# Patient Record
Sex: Male | Born: 1970 | Race: Black or African American | Hispanic: No | Marital: Married | Smoking: Never smoker
Health system: Southern US, Community
[De-identification: ages and names within clinical notes are randomized; demographics above are authoritative.]

## PROBLEM LIST (undated history)

## (undated) HISTORY — PX: COLONOSCOPY: SHX174

## (undated) HISTORY — PX: UPPER GASTROINTESTINAL ENDOSCOPY: SHX188

---

## 2010-07-15 ENCOUNTER — Ambulatory Visit: Payer: Self-pay | Admitting: Oncology

## 2010-07-18 LAB — CBC WITH DIFFERENTIAL/PLATELET
Basophils Absolute: 0 10*3/uL (ref 0.0–0.1)
Eosinophils Absolute: 0 10*3/uL (ref 0.0–0.5)
HGB: 14.4 g/dL (ref 13.0–17.1)
LYMPH%: 29.4 % (ref 14.0–49.0)
MCV: 87.8 fL (ref 79.3–98.0)
MONO%: 11.9 % (ref 0.0–14.0)
NEUT#: 2.1 10*3/uL (ref 1.5–6.5)
Platelets: 258 10*3/uL (ref 140–400)

## 2010-07-18 LAB — COMPREHENSIVE METABOLIC PANEL
CO2: 27 mEq/L (ref 19–32)
Creatinine, Ser: 0.99 mg/dL (ref 0.40–1.50)
Glucose, Bld: 111 mg/dL — ABNORMAL HIGH (ref 70–99)
Sodium: 136 mEq/L (ref 135–145)
Total Bilirubin: 0.5 mg/dL (ref 0.3–1.2)
Total Protein: 7.4 g/dL (ref 6.0–8.3)

## 2010-08-20 ENCOUNTER — Ambulatory Visit: Payer: Self-pay | Admitting: Oncology

## 2010-08-22 LAB — CBC WITH DIFFERENTIAL/PLATELET
Basophils Absolute: 0 10*3/uL (ref 0.0–0.1)
Eosinophils Absolute: 0.1 10*3/uL (ref 0.0–0.5)
LYMPH%: 33.9 % (ref 14.0–49.0)
MCV: 85.9 fL (ref 79.3–98.0)
MONO%: 8.6 % (ref 0.0–14.0)
NEUT#: 3.2 10*3/uL (ref 1.5–6.5)
Platelets: 239 10*3/uL (ref 140–400)
RBC: 5.1 10*6/uL (ref 4.20–5.82)

## 2010-08-22 LAB — COMPREHENSIVE METABOLIC PANEL
Alkaline Phosphatase: 53 U/L (ref 39–117)
BUN: 15 mg/dL (ref 6–23)
Glucose, Bld: 108 mg/dL — ABNORMAL HIGH (ref 70–99)
Total Bilirubin: 0.6 mg/dL (ref 0.3–1.2)

## 2011-07-14 ENCOUNTER — Encounter (INDEPENDENT_AMBULATORY_CARE_PROVIDER_SITE_OTHER): Payer: Self-pay | Admitting: General Surgery

## 2011-07-27 ENCOUNTER — Ambulatory Visit (INDEPENDENT_AMBULATORY_CARE_PROVIDER_SITE_OTHER): Payer: Self-pay | Admitting: General Surgery

## 2012-11-03 DIAGNOSIS — B0089 Other herpesviral infection: Secondary | ICD-10-CM | POA: Insufficient documentation

## 2012-11-03 DIAGNOSIS — B009 Herpesviral infection, unspecified: Secondary | ICD-10-CM | POA: Insufficient documentation

## 2013-02-01 DIAGNOSIS — B181 Chronic viral hepatitis B without delta-agent: Secondary | ICD-10-CM | POA: Insufficient documentation

## 2013-02-01 DIAGNOSIS — B1910 Unspecified viral hepatitis B without hepatic coma: Secondary | ICD-10-CM | POA: Insufficient documentation

## 2013-06-02 ENCOUNTER — Ambulatory Visit
Admission: RE | Admit: 2013-06-02 | Discharge: 2013-06-02 | Disposition: A | Discharge status: Home / Self Care | Payer: Worker's Compensation | Source: Ambulatory Visit | Attending: Internal Medicine | Admitting: Internal Medicine

## 2013-06-02 ENCOUNTER — Other Ambulatory Visit: Payer: Self-pay | Admitting: Internal Medicine

## 2013-06-02 DIAGNOSIS — S20211A Contusion of right front wall of thorax, initial encounter: Secondary | ICD-10-CM

## 2014-10-30 ENCOUNTER — Emergency Department (HOSPITAL_COMMUNITY)
Admission: EM | Admit: 2014-10-30 | Discharge: 2014-10-30 | Disposition: A | Discharge status: Home / Self Care | Payer: Self-pay | Attending: Emergency Medicine | Admitting: Emergency Medicine

## 2014-10-30 ENCOUNTER — Encounter (HOSPITAL_COMMUNITY): Payer: Self-pay | Admitting: *Deleted

## 2014-10-30 DIAGNOSIS — S29002A Unspecified injury of muscle and tendon of back wall of thorax, initial encounter: Secondary | ICD-10-CM | POA: Insufficient documentation

## 2014-10-30 DIAGNOSIS — S161XXA Strain of muscle, fascia and tendon at neck level, initial encounter: Secondary | ICD-10-CM | POA: Insufficient documentation

## 2014-10-30 DIAGNOSIS — Y998 Other external cause status: Secondary | ICD-10-CM | POA: Insufficient documentation

## 2014-10-30 DIAGNOSIS — Y9389 Activity, other specified: Secondary | ICD-10-CM | POA: Insufficient documentation

## 2014-10-30 DIAGNOSIS — Z7952 Long term (current) use of systemic steroids: Secondary | ICD-10-CM | POA: Insufficient documentation

## 2014-10-30 DIAGNOSIS — Y92410 Unspecified street and highway as the place of occurrence of the external cause: Secondary | ICD-10-CM | POA: Insufficient documentation

## 2014-10-30 DIAGNOSIS — Z79899 Other long term (current) drug therapy: Secondary | ICD-10-CM | POA: Insufficient documentation

## 2014-10-30 MED ORDER — IBUPROFEN 600 MG PO TABS: 600.0000 mg | ORAL_TABLET | Freq: Four times a day (QID) | ORAL | Status: DC | PRN

## 2014-10-30 MED ORDER — TRAMADOL HCL 50 MG PO TABS: 50.0000 mg | ORAL_TABLET | Freq: Four times a day (QID) | ORAL | Status: DC | PRN

## 2014-10-30 MED ORDER — DIAZEPAM 5 MG PO TABS: 5.0000 mg | ORAL_TABLET | Freq: Two times a day (BID) | ORAL | Status: DC | PRN
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
## 2021-03-06 ENCOUNTER — Ambulatory Visit (HOSPITAL_COMMUNITY)
Admission: EM | Admit: 2021-03-06 | Discharge: 2021-03-06 | Disposition: A | Discharge status: Home / Self Care | Payer: Self-pay | Attending: Physician Assistant | Admitting: Physician Assistant

## 2021-03-06 ENCOUNTER — Encounter (HOSPITAL_COMMUNITY): Payer: Self-pay

## 2021-03-06 ENCOUNTER — Other Ambulatory Visit: Payer: Self-pay

## 2021-03-06 DIAGNOSIS — R1310 Dysphagia, unspecified: Secondary | ICD-10-CM

## 2021-03-06 DIAGNOSIS — R001 Bradycardia, unspecified: Secondary | ICD-10-CM

## 2021-03-06 DIAGNOSIS — R142 Eructation: Secondary | ICD-10-CM

## 2021-03-06 DIAGNOSIS — R21 Rash and other nonspecific skin eruption: Secondary | ICD-10-CM

## 2021-03-06 MED ORDER — LIDOCAINE VISCOUS HCL 2 % MT SOLN
OROMUCOSAL | Status: AC
  Filled 2021-03-06: qty 15

## 2021-03-06 MED ORDER — ALUM & MAG HYDROXIDE-SIMETH 200-200-20 MG/5ML PO SUSP
ORAL | Status: AC
  Filled 2021-03-06: qty 30

## 2021-03-06 MED ORDER — ALUM & MAG HYDROXIDE-SIMETH 200-200-20 MG/5ML PO SUSP
30.0000 mL | Freq: Once | ORAL | Status: AC
  Administered 2021-03-06: 30 mL via ORAL

## 2021-03-06 MED ORDER — PANTOPRAZOLE SODIUM 40 MG PO TBEC: 40.0000 mg | DELAYED_RELEASE_TABLET | Freq: Every day | ORAL | 0 refills | Status: DC

## 2021-03-06 MED ORDER — SUCRALFATE 1 G PO TABS: 1.0000 g | ORAL_TABLET | Freq: Three times a day (TID) | ORAL | 0 refills | Status: DC | PRN

## 2021-03-06 MED ORDER — LIDOCAINE VISCOUS HCL 2 % MT SOLN
15.0000 mL | Freq: Once | OROMUCOSAL | Status: AC
  Administered 2021-03-06: 15 mL via ORAL

## 2021-03-06 MED ORDER — TRIAMCINOLONE ACETONIDE 0.1 % EX CREA: 1.0000 "application " | TOPICAL_CREAM | Freq: Two times a day (BID) | CUTANEOUS | 0 refills | Status: DC

## 2021-03-06 NOTE — ED Provider Notes (Signed)
[facility]    CSN: [id number] Arrival date & time: 03/06/21  7829      History   Chief Complaint Chief Complaint  Patient presents with   Chest Pain   Fatigue    HPI Juan Palmer is a 50 y.o. male.   Patient presenting today with about a week of burning pain and pressure that extends from the lower neck down toward central chest and only occurs with swallowing or eating.  Pain has been worse the last 24 hours.  Some fatigue but otherwise no other associated new symptoms and feeling in usual state of health otherwise.  Denies shortness of breath, headache, dizziness, lightheadedness, syncope, abdominal pain, nausea vomiting or diarrhea.  No known past medical problems.  Has not tried anything over-the-counter for symptoms.   History reviewed. No pertinent past medical history.  There are no problems to display for this patient.   History reviewed. No pertinent surgical history.   Home Medications    Prior to Admission medications   Medication Sig Start Date End Date Taking? Authorizing Provider  pantoprazole (PROTONIX) 40 MG tablet Take 1 tablet (40 mg total) by mouth daily. 03/06/21  Yes Particia Nearing, PA-C  sucralfate (CARAFATE) 1 g tablet Take 1 tablet (1 g total) by mouth 3 (three) times daily as needed. Dissolve 1 tablet in a glass of water and drink 03/06/21  Yes Particia Nearing, PA-C  triamcinolone cream (KENALOG) 0.1 % Apply 1 application topically 2 (two) times daily. 03/06/21  Yes Particia Nearing, PA-C  calcium-vitamin D (OSCAL WITH D) 500-200 MG-UNIT per tablet Take 1 tablet by mouth daily.      [provider]  diazepam (VALIUM) 5 MG tablet Take 1 tablet (5 mg total) by mouth every 12 (twelve) hours as needed for anxiety or muscle spasms. 10/30/14   Lurene Shadow, PA-C  hydrocortisone (ANUSOL-HC) 2.5 % rectal cream Place rectally 2 (two) times daily.      [provider]  ibuprofen (ADVIL,MOTRIN) 600 MG tablet Take  1 tablet (600 mg total) by mouth every 6 (six) hours as needed. 10/30/14   Lurene Shadow, PA-C  MULTIPLE VITAMIN PO Take by mouth.      [provider]  traMADol (ULTRAM) 50 MG tablet Take 1 tablet (50 mg total) by mouth every 6 (six) hours as needed. 10/30/14   Lurene Shadow, PA-C    Family History Family History  Problem Relation Age of Onset   Healthy Mother    Healthy Father     Social History Social History   Tobacco Use   Smoking status: Never   Smokeless tobacco: Never  Substance Use Topics   Alcohol use: No   Drug use: No   Allergies   Patient has no known allergies.   Review of Systems Review of Systems Per HPI  Physical Exam Triage Vital Signs ED Triage Vitals  Enc Vitals Group     BP 03/06/21 1001 115/71     Pulse Rate 03/06/21 1001 (!) 55     Resp 03/06/21 1001 19     Temp 03/06/21 1001 97.9 F (36.6 C)     Temp src --      SpO2 03/06/21 1001 100 %     Weight --      Height --      Head Circumference --      Peak Flow --      Pain Score 03/06/21 0959 4  Pain Loc --      Pain Edu? --      Excl. in GC? --    No data found.  Updated Vital Signs BP 115/71   Pulse (!) 55   Temp 97.9 F (36.6 C)   Resp 19   SpO2 100%   Visual Acuity Right Eye Distance:   Left Eye Distance:   Bilateral Distance:    Right Eye Near:   Left Eye Near:    Bilateral Near:     Physical Exam Vitals and nursing note reviewed.  Constitutional:      Appearance: Normal appearance.  HENT:     Head: Atraumatic.     Mouth/Throat:     Mouth: Mucous membranes are moist.     Pharynx: Oropharynx is clear.  Eyes:     Extraocular Movements: Extraocular movements intact.     Conjunctiva/sclera: Conjunctivae normal.  Cardiovascular:     Rate and Rhythm: Regular rhythm. Bradycardia present.     Heart sounds: Normal heart sounds.  Pulmonary:     Effort: Pulmonary effort is normal. No respiratory distress.     Breath sounds: Normal breath sounds. No  wheezing or rales.  Abdominal:     General: Bowel sounds are normal. There is no distension.     Palpations: Abdomen is soft.     Tenderness: There is no abdominal tenderness. There is no guarding.  Musculoskeletal:        General: Normal range of motion.     Cervical back: Normal range of motion and neck supple.  Skin:    General: Skin is warm and dry.  Neurological:     General: No focal deficit present.     Mental Status: He is oriented to person, place, and time.  Psychiatric:        Mood and Affect: Mood normal.        Thought Content: Thought content normal.        Judgment: Judgment normal.   UC Treatments / Results  Labs (all labs ordered are listed, but only abnormal results are displayed) Labs Reviewed - No data to display  EKG  Radiology No results found.  Procedures Procedures (including critical care time)  Medications Ordered in UC Medications  alum & mag hydroxide-simeth (MAALOX/MYLANTA) 200-200-20 MG/5ML suspension 30 mL (30 mLs Oral Given 03/06/21 1035)    And  lidocaine (XYLOCAINE) 2 % viscous mouth solution 15 mL (15 mLs Oral Given 03/06/21 1035)    Initial Impression / Assessment and Plan / UC Course  I have reviewed the triage vital signs and the nursing notes.  Pertinent labs & imaging results that were available during my care of the patient were reviewed by me and considered in my medical decision making (see chart for details).     Vital signs reassuring other than bradycardia which per chart review and patient report is fairly normal for him.  EKG showing sinus bradycardia but otherwise benign appearing.  GI cocktail administered to attempt to determine if indigestion related, which symptoms are consistent with.  Complete resolution of his pain with swallowing after GI cocktail so suspect some reflux esophagitis causing his pain at this point.  Treat with Carafate, Protonix, bland foods.  Has a new patient appointment with primary care in 2 weeks  and can use this is a follow-up for his current symptoms.  He is aware to go to the emergency room if symptoms worsening at any point.  He is also requesting a refill on  triamcinolone cream for some itchy and dry patches that he is had for years.  He states he ran out since moving from [place] and is waiting to establish care next month to get refills.  Refill sent.  Final Clinical Impressions(s) / UC Diagnoses   Final diagnoses:  Pain with swallowing  Belching  Rash and nonspecific skin eruption   Discharge Instructions   None    ED Prescriptions     Medication Sig Dispense Auth. Provider   triamcinolone cream (KENALOG) 0.1 % Apply 1 application topically 2 (two) times daily. 90 g Particia Nearing, PA-C   sucralfate (CARAFATE) 1 g tablet Take 1 tablet (1 g total) by mouth 3 (three) times daily as needed. Dissolve 1 tablet in a glass of water and drink 60 tablet Particia Nearing, PA-C   pantoprazole (PROTONIX) 40 MG tablet Take 1 tablet (40 mg total) by mouth daily. 30 tablet Particia Nearing, [place]      PDMP not reviewed this encounter.   Particia Nearing, [place] 03/06/21 1116

## 2021-03-06 NOTE — ED Triage Notes (Signed)
Pt presents with complaints of pain and heaviness in his chest only when he swallows. Denies pain any other time. Pt also endorses fatigue. States symptoms started x 1 week ago. Denies any other symptoms.

## 2021-03-26 ENCOUNTER — Ambulatory Visit: Payer: Self-pay | Admitting: Family Medicine

## 2021-03-31 ENCOUNTER — Other Ambulatory Visit: Payer: Self-pay | Admitting: Family Medicine

## 2021-04-02 NOTE — Progress Notes (Deleted)
    SUBJECTIVE:   CHIEF COMPLAINT / HPI: establish care  50 yo male presents today to establish care  PMH: PSH: Meds: Allergies: Fam hx: Social Hx: Patient is originally from ***. He moved to the [place] in ***.   PERTINENT  PMH / PSH: ***  OBJECTIVE:   There were no vitals taken for this visit.  ***  ASSESSMENT/PLAN:   No problem-specific Assessment & Plan notes found for this encounter.     Shirlean Mylar, MD [facility]

## 2021-04-03 ENCOUNTER — Ambulatory Visit: Payer: Self-pay | Admitting: Family Medicine

## 2021-04-10 ENCOUNTER — Ambulatory Visit: Payer: Self-pay | Admitting: Student

## 2021-04-18 NOTE — Patient Instructions (Addendum)
It was nice seeing you today!  I am concerned about your weight.  Follow-up in 2 weeks. We will go over lab results then.  Please arrive at least 15 minutes prior to your scheduled appointments.  Stay well, Juan Deeds, MD [facility] [phone]

## 2021-04-18 NOTE — Progress Notes (Signed)
    SUBJECTIVE:   CHIEF COMPLAINT / HPI: establish care  Recently moved from [place] this past January. Lives with wife and 3 kids. Previously lived in [place].  Recently seen in the ED on 6/23 for pain with swallowing, improved with GI cocktail so was thought to be some component of reflux esophagitis so was prescribed pantoprazole and sucralfate. Today, patient states he never had any pain with swallowing and that the medications did not help so he stopped taking them.  Fatigue Reports fatigue, numbness in bilateral hands and feet for 2 months. Wakes up really tired, feeling generally weak. Denies polyuria. Taking vitamin B12, calcium, vitamin D, magnesium because he thought it may help his symptoms. Reports history of vitamin D deficiency. Reports he lost about 2 pounds the past several months, used to be 126 lbs.  States he has been a thin guy for his whole life. Denies SOB, abdominal pain, diarrhea, constipation. Reports occasional chest pain once a week not related to activity lasting about 15 minutes.  Aphthous ulcers Patient states he sometimes has ulcers in his mouth, previously was receiving triamcinolone acetonide and asking for a prescription for this.  PERTINENT  PMH / PSH: hepatitis B (reports diagnosed in 2001, not treated because his levels were low, was seeing specialist in [place])  OBJECTIVE:   BP 100/60   Pulse 60   Ht 6\' 2"  (1.88 m)   Wt 124 lb 4.8 oz (56.4 kg)   SpO2 100%   BMI 15.96 kg/m   General: thin middle-aged male, NAD Eyes: PERRL, EOMI, no scleral icterus CV: RRR, no murmurs Pulm: CTAB, no wheezes or rales Abd: soft, non-tender, +BS  Wt Readings from Last 3 Encounters:  04/23/21 124 lb 4.8 oz (56.4 kg)     ASSESSMENT/PLAN:   Encounter to establish care  History of hepatitis B - Plan: Hepatitis B surface antibody,quantitative, Hepatitis B surface antigen, Hepatitis B core antibody, total Refer to ID pending HBV labs.  Fatigue,  unspecified type - Plan: Magnesium, Comprehensive metabolic panel, CBC, Vitamin D, 25-hydroxy, Vitamin B12, TSH Rfx on Abnormal to Free T4 Unclear etiology, appears improved. BMI 16 but patient denies significant weight loss. Checking labs as above.  Numbness and tingling of upper and lower extremities of both sides - Plan: Vitamin B12  Checking A1c to assess for DM, could consider diabetic neuropathy Numbness/tingling resolved at this time  Aphthous ulcers No current lesions. Refilled triamcinolone per patient request.   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  Can consider prescribing valacyclovir at follow-up visit.  It appears he has been evaluated for fatigue in the past, found to have low vitamin D in 2015.  He was also seen in May of this year at urgent care and had multiple labs done.  CBC, CMP, thyroid studies, A1c, and vitamin D were normal at that time.  June, MD [facility]

## 2021-04-23 ENCOUNTER — Ambulatory Visit (INDEPENDENT_AMBULATORY_CARE_PROVIDER_SITE_OTHER): Payer: Self-pay | Admitting: Family Medicine

## 2021-04-23 ENCOUNTER — Encounter: Payer: Self-pay | Admitting: Family Medicine

## 2021-04-23 ENCOUNTER — Other Ambulatory Visit: Payer: Self-pay

## 2021-04-23 VITALS — BP 100/60 | HR 60 | Ht 74.0 in | Wt 124.3 lb

## 2021-04-23 DIAGNOSIS — Z13220 Encounter for screening for lipoid disorders: Secondary | ICD-10-CM

## 2021-04-23 DIAGNOSIS — Z1159 Encounter for screening for other viral diseases: Secondary | ICD-10-CM

## 2021-04-23 DIAGNOSIS — L309 Dermatitis, unspecified: Secondary | ICD-10-CM | POA: Insufficient documentation

## 2021-04-23 DIAGNOSIS — Z125 Encounter for screening for malignant neoplasm of prostate: Secondary | ICD-10-CM

## 2021-04-23 DIAGNOSIS — R739 Hyperglycemia, unspecified: Secondary | ICD-10-CM

## 2021-04-23 DIAGNOSIS — R200 Anesthesia of skin: Secondary | ICD-10-CM

## 2021-04-23 DIAGNOSIS — Z114 Encounter for screening for human immunodeficiency virus [HIV]: Secondary | ICD-10-CM

## 2021-04-23 DIAGNOSIS — Z7689 Persons encountering health services in other specified circumstances: Secondary | ICD-10-CM

## 2021-04-23 DIAGNOSIS — B1910 Unspecified viral hepatitis B without hepatic coma: Secondary | ICD-10-CM

## 2021-04-23 DIAGNOSIS — R202 Paresthesia of skin: Secondary | ICD-10-CM

## 2021-04-23 DIAGNOSIS — R5383 Other fatigue: Secondary | ICD-10-CM

## 2021-04-23 MED ORDER — TRIAMCINOLONE ACETONIDE 0.1 % EX CREA: 1.0000 "application " | TOPICAL_CREAM | Freq: Two times a day (BID) | CUTANEOUS | 0 refills | Status: DC | PRN

## 2021-04-24 LAB — COMPREHENSIVE METABOLIC PANEL
ALT: 24 IU/L (ref 0–44)
AST: 29 IU/L (ref 0–40)
Albumin/Globulin Ratio: 1.6 (ref 1.2–2.2)
Albumin: 4.4 g/dL (ref 4.0–5.0)
Alkaline Phosphatase: 56 IU/L (ref 44–121)
BUN/Creatinine Ratio: 18 (ref 9–20)
BUN: 17 mg/dL (ref 6–24)
Bilirubin Total: 0.5 mg/dL (ref 0.0–1.2)
CO2: 26 mmol/L (ref 20–29)
Calcium: 9.6 mg/dL (ref 8.7–10.2)
Chloride: 101 mmol/L (ref 96–106)
Creatinine, Ser: 0.95 mg/dL (ref 0.76–1.27)
Globulin, Total: 2.7 g/dL (ref 1.5–4.5)
Glucose: 87 mg/dL (ref 65–99)
Potassium: 4.8 mmol/L (ref 3.5–5.2)
Sodium: 138 mmol/L (ref 134–144)
Total Protein: 7.1 g/dL (ref 6.0–8.5)
eGFR: 99 mL/min/{1.73_m2} (ref >59)

## 2021-04-24 LAB — CBC
Hematocrit: 42.6 % (ref 37.5–51.0)
Hemoglobin: 14 g/dL (ref 13.0–17.7)
MCH: 29.8 pg (ref 26.6–33.0)
MCHC: 32.9 g/dL (ref 31.5–35.7)
MCV: 91 fL (ref 79–97)
Platelets: 231 10*3/uL (ref 150–450)
RBC: 4.7 x10E6/uL (ref 4.14–5.80)
RDW: 13 % (ref 11.6–15.4)
WBC: 3.7 10*3/uL (ref 3.4–10.8)

## 2021-04-24 LAB — HEPATITIS B CORE ANTIBODY, TOTAL: Hep B Core Total Ab: POSITIVE — AB

## 2021-04-24 LAB — LIPID PANEL
Chol/HDL Ratio: 3.2 ratio (ref 0.0–5.0)
Cholesterol, Total: 200 mg/dL — ABNORMAL HIGH (ref 100–199)
HDL: 63 mg/dL (ref >39)
LDL Chol Calc (NIH): 125 mg/dL — ABNORMAL HIGH (ref 0–99)
Triglycerides: 65 mg/dL (ref 0–149)
VLDL Cholesterol Cal: 12 mg/dL (ref 5–40)

## 2021-04-24 LAB — HEPATITIS B SURFACE ANTIBODY, QUANTITATIVE: Hepatitis B Surf Ab Quant: <3.1 m[IU]/mL — ABNORMAL LOW (ref >9.9)

## 2021-04-24 LAB — TSH RFX ON ABNORMAL TO FREE T4: TSH: 0.849 u[IU]/mL (ref 0.450–4.500)

## 2021-04-24 LAB — MAGNESIUM: Magnesium: 2.1 mg/dL (ref 1.6–2.3)

## 2021-04-24 LAB — HCV AB W REFLEX TO QUANT PCR: HCV Ab: <0.1 s/co ratio (ref 0.0–0.9)

## 2021-04-24 LAB — VITAMIN D 25 HYDROXY (VIT D DEFICIENCY, FRACTURES): Vit D, 25-Hydroxy: 56.9 ng/mL (ref 30.0–100.0)

## 2021-04-24 LAB — HIV ANTIBODY (ROUTINE TESTING W REFLEX): HIV Screen 4th Generation wRfx: NONREACTIVE

## 2021-04-24 LAB — PSA: Prostate Specific Ag, Serum: 0.1 ng/mL (ref 0.0–4.0)

## 2021-04-24 LAB — HEPATITIS B SURFACE ANTIGEN: Hepatitis B Surface Ag: POSITIVE — AB

## 2021-04-24 LAB — HEMOGLOBIN A1C
Est. average glucose Bld gHb Est-mCnc: 108 mg/dL
Hgb A1c MFr Bld: 5.4 % (ref 4.8–5.6)

## 2021-04-24 LAB — VITAMIN B12: Vitamin B-12: 1517 pg/mL — ABNORMAL HIGH (ref 232–1245)

## 2021-04-24 LAB — HCV INTERPRETATION

## 2021-04-25 NOTE — Addendum Note (Signed)
Addended by: Littie Deeds D on: 04/25/2021 08:25 AM   Modules accepted: Orders

## 2021-05-02 ENCOUNTER — Other Ambulatory Visit (HOSPITAL_COMMUNITY): Payer: Self-pay

## 2021-05-02 ENCOUNTER — Telehealth: Payer: Self-pay

## 2021-05-02 NOTE — Telephone Encounter (Signed)
[facility] Patient Advocate Encounter ? ?Insurance verification completed.   ? ?The patient is uninsured and will need patient assistance for medication. ? ?We can complete the application and will need to meet with the patient for signatures and income documentation. ? ?Taniah Reinecke, CPhT ?Specialty Pharmacy Patient Advocate ?[facility] ?Phone: [phone] ?Fax:  [phone]  ?

## 2021-05-05 ENCOUNTER — Ambulatory Visit (INDEPENDENT_AMBULATORY_CARE_PROVIDER_SITE_OTHER): Payer: Self-pay | Admitting: Infectious Diseases

## 2021-05-05 ENCOUNTER — Other Ambulatory Visit: Payer: Self-pay

## 2021-05-05 VITALS — BP 111/71 | HR 83 | Temp 97.6°F | Resp 16 | Ht 74.0 in | Wt 124.0 lb

## 2021-05-05 DIAGNOSIS — B181 Chronic viral hepatitis B without delta-agent: Secondary | ICD-10-CM

## 2021-05-05 NOTE — Patient Instructions (Addendum)
It was nice seeing you today!  Take valacyclovir twice a day for 3 days as soon as an outbreak starts.  Continue taking over the counter medications and try heating pads. Please call if neck pain is not improved in 1 month.  Follow-up in 6 months or sooner if needed.  Please arrive at least 15 minutes prior to your scheduled appointments.  Stay well, Littie Deeds, MD [facility] [phone]

## 2021-05-05 NOTE — Progress Notes (Signed)
    SUBJECTIVE:   CHIEF COMPLAINT / HPI:   Patient was seen 2 weeks ago for establish care visit.  He was referred to infectious disease for chronic hepatitis B which was confirmed on serology.  He had a visit with infectious disease on 8/22 where he had further labs and ultrasound of the abdomen with elastography ordered.  Fatigue Patient had been reporting fatigue for 2 months as well as numbness in bilateral hands and feet which had resolved.  Labs obtained (CMP, CBC, vitamin D, magnesium, vitamin B12, TSH, A1c) were unrevealing.  Patient states he is no longer feeling fatigued.  He has stopped taking over-the-counter supplements as instructed.  HSV-1 On chart review, patient has a history of HSV-1 and had been prescribed valacyclovir previously for outbreaks.  He was unaware of valacyclovir prescription.  Requesting triamcinolone dental paste as incorrect medication was ordered at last visit.  Neck pain Experiencing neck pain radiating around head for the past 2 weeks intermittently, worse in the mornings. Had similar pain last year, went away on its own. Taking OTC medications with relief. No radiation of pain to arms.  Previously was having numbness in his bilateral hands and feet but this has resolved.  Having neck pain currently.   PERTINENT  PMH / PSH: Chronic hepatitis B (untreated), eczema, HSV-1  OBJECTIVE:   BP (!) 95/52   Pulse 67   Wt 124 lb 12.8 oz (56.6 kg)   SpO2 100%   BMI 16.02 kg/m   General: thin middle-aged male, slouched when seated, NAD Neck: Supple, no midline or paraspinal tenderness, full ROM without pain CV: RRR, no murmurs Pulm: CTAB, no wheezes or rales  Wt Readings from Last 3 Encounters:  05/07/21 124 lb 12.8 oz (56.6 kg)  05/05/21 124 lb (56.2 kg)  04/23/21 124 lb 4.8 oz (56.4 kg)     ASSESSMENT/PLAN:   HSV-1 infection Triamcinolone dental paste ordered per patient request.  Prescription given for valacyclovir as needed for treatment when  having outbreak.  Hepatitis B Has established with infectious disease.  Hepatitis A antibody positive, will not need vaccination.  We will have abdominal ultrasound with elastography and has ID follow-up.   Neck pain Neck pain versus tension headache, ongoing for 2 weeks.  Poor posture may be contributing.  Exam unremarkable.  He will continue taking OTC medications and try heating pads.  Consider plain films of cervical spine if not improved in 1 month.  Patient will call if not improving.  Fatigue Labs unremarkable.  Fatigue appears to have resolved.  We will continue to monitor.  Littie Deeds, MD [facility]

## 2021-05-05 NOTE — Progress Notes (Addendum)
 [facility]                                      [address], [place], [place], [address]                                               Phn. [phone]; Fax: [phone]                                                               Date: 05/05/2021 Reason for Visit: Hepatitis B Initial Visit    HPI: Juan Palmer is a 50 y.o.old male with a history of hepatitis B who is referred from PCP for evaluation and management of hepatitis B. patient tells me he is originally from [place] and moved to [place] in 2001 where he was tested for hepatitis B as a part of screening and was found to have positive for hepatitis B. he stayed in [place] from 2001-2007 and finally moved to [place]  in 2007 with his family.  He stayed in [place] for a while and then moved to [place]  and came back to [place] in January 2022.  He tells me he has been nearly following up with a provider for his hepatitis B but never has been on treatment.  He also tells me that he had ultrasound of his liver in the past.  He tells me his wife is also positive for hepatitis B and was on follow-up with a provider but has never been on treatment.  He tells me he was diagnosed with hepatitis B before meeting his wife.  His wife mother and sister also has a history of hepatitis B. he denies any other sexual partners apart from his wife.   Complains of headache for approximately a week.  Denies any fever, chills and sweats.  Denies any new neurological symptoms like blurry vision, dizziness or hearing issues.  Denies any history of hypertension. he has recently seen an eye doctor.  He has a follow-up with his PCP. on 8/24.   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  He is married and has 3 kids who are 58 and half years old, 108 and half years old and 4 and half years old.  Denies smoking, alcohol and using IVDU.  Denies any known medical history except hepatitis  B, denies any surgical history or any allergies.   ROS: Denies yellowish discoloration of sclera and skin, abdominal pain/distension, hematemesis.            Denies cough, fever, chills, nightsweats, nausea, vomiting, diarrhea, constipation, weight loss, recent hospitalizations, rashes, joint complaints, shortness of breath, chest pain, headaches, dysuria .  Current Outpatient Medications on File Prior to Visit  Medication Sig Dispense Refill   calcium -vitamin D  (OSCAL WITH D) 500-200 MG-UNIT per tablet Take 1 tablet by mouth daily.       hydrocortisone  (ANUSOL -HC) 2.5 % rectal cream Place rectally 2 (two) times daily.       triamcinolone  cream (KENALOG ) 0.1 % Apply 1 application topically 2 (two)  times daily as needed. 90 g 0   No current facility-administered medications on file prior to visit.    No Known Allergies  No past medical history on file.  No past surgical history on file.  Social History   Socioeconomic History   Marital status: Married    Spouse name: Not on file   Number of children: Not on file   Years of education: Not on file   Highest education level: Not on file  Occupational History   Not on file  Tobacco Use   Smoking status: Never   Smokeless tobacco: Never  Substance and Sexual Activity   Alcohol use: No   Drug use: No   Sexual activity: Not on file  Other Topics Concern   Not on file  Social History Narrative   Not on file   Social Determinants of Health   Financial Resource Strain: Not on file  Food Insecurity: Not on file  Transportation Needs: Not on file  Physical Activity: Not on file  Stress: Not on file  Social Connections: Not on file  Intimate Partner Violence: Not on file   Family History  Problem Relation Age of Onset   Healthy Mother    Healthy Father      Physical exam: BP 111/71   Pulse 83   Temp 97.6 F (36.4 C)   Resp 16   Ht 6' 2 (1.88 m)   Wt 124 lb (56.2 kg)   SpO2 95%   BMI 15.92 kg/m '  Gen: Alert  and oriented x 3, no acute distress HEENT: [place]/AT, PERL, no scleral icterus, no pale conjunctivae, hearing normal, oral mucosa moist Neck: Supple, no lymphadenopathy Cardio: Regular rate and rhythm; +S1 and S2; no murmurs, gallops, or rubs Resp: CTAB; no wheezes, rhonchi, or rales GI: Soft, nontender, nondistended, bowel sounds present GU: Musc: Extremities: No cyanosis, clubbing, or edema; +2 PT and DP pulses Skin: No rashes, lesions, or ecchymoses, vitiligo in his lower back  Neuro: No focal deficits Psych: Calm, cooperative   Laboratory  CBC Latest Ref Rng & Units 04/23/2021 08/22/2010 07/18/2010  WBC 3.4 - 10.8 x10E3/uL 3.7 5.8 3.6(L)  Hemoglobin 13.0 - 17.7 g/dL 85.9 84.8 85.5  Hematocrit 37.5 - 51.0 % 42.6 43.8 41.7  Platelets 150 - 450 x10E3/uL 231 239 258   CMP Latest Ref Rng & Units 04/23/2021 08/22/2010 07/18/2010  Glucose 65 - 99 mg/dL 87 891(Y) 888(Y)  BUN 6 - 24 mg/dL 17 15 12   Creatinine 0.76 - 1.27 mg/dL 9.04 9.02 9.00  Sodium 134 - 144 mmol/L 138 137 136  Potassium 3.5 - 5.2 mmol/L 4.8 4.1 3.9  Chloride 96 - 106 mmol/L 101 100 99  CO2 20 - 29 mmol/L 26 27 27   Calcium  8.7 - 10.2 mg/dL 9.6 9.1 8.8  Total Protein 6.0 - 8.5 g/dL 7.1 7.3 7.4  Total Bilirubin 0.0 - 1.2 mg/dL 0.5 0.6 0.5  Alkaline Phos 44 - 121 IU/L 56 53 55  AST 0 - 40 IU/L 29 25 28   ALT 0 - 44 IU/L 24 20 20    Assessment/Plan: # Hepatitis B Discussed the pathogenesis, transmission, prevention, risks of left untreated, and treatment options for hepatitis C Orders Placed This Encounter  Procedures   US  ABDOMEN COMPLETE W/ELASTOGRAPHY   Hepatitis B core antibody, IgM   Hepatitis B DNA, ultraquantitative, PCR   Hepatitis B e antibody   Hepatitis B e antigen   Protime-INR   Hepatitis A antibody, total   Hepatitis delta  antibody   Follow up in 6 weeks, sooner if needed depending on labs today  # Preventive measures discussed  Vaccination for household and sexual contacts if not already immune Use  barrier protection during sexual intercourse if partner is not vaccinated or is not naturally immune No sharing of toothbrushes or razors or nail clippers No sharing of injection equipment/glucose testing equipment Cover open cuts and scratches Clean blood spills with bleach solution  # Headache - analgesics - discussed regarding warning sign and symptoms to seek urgent attention - Follow up with PCP this week   # Vaccination  Hep A vaccination if non immune  I spent more than 60 minutes with the patient including review of prior medical records with greater than 50% of time in face to face counsel of the patient.    Patient's labs were reviewed as well as his previous records. Patients questions were addressed and answered.   Electronically signed by:  Annalee Orem, MD Infectious Diseases  Office phone [phone] Fax no. [phone]

## 2021-05-07 ENCOUNTER — Other Ambulatory Visit: Payer: Self-pay

## 2021-05-07 ENCOUNTER — Ambulatory Visit (INDEPENDENT_AMBULATORY_CARE_PROVIDER_SITE_OTHER): Payer: Self-pay | Admitting: Family Medicine

## 2021-05-07 VITALS — BP 95/52 | HR 67 | Wt 124.8 lb

## 2021-05-07 DIAGNOSIS — B181 Chronic viral hepatitis B without delta-agent: Secondary | ICD-10-CM

## 2021-05-07 DIAGNOSIS — M542 Cervicalgia: Secondary | ICD-10-CM

## 2021-05-07 DIAGNOSIS — B009 Herpesviral infection, unspecified: Secondary | ICD-10-CM

## 2021-05-07 MED ORDER — VALACYCLOVIR HCL 500 MG PO TABS: 500.0000 mg | ORAL_TABLET | Freq: Two times a day (BID) | ORAL | 3 refills | Status: AC

## 2021-05-07 MED ORDER — TRIAMCINOLONE ACETONIDE 0.1 % MT PSTE: 1.0000 "application " | PASTE | Freq: Two times a day (BID) | OROMUCOSAL | 12 refills | Status: DC

## 2021-05-07 NOTE — Assessment & Plan Note (Signed)
Has established with infectious disease.  Hepatitis A antibody positive, will not need vaccination.  We will have abdominal ultrasound with elastography and has ID follow-up.

## 2021-05-07 NOTE — Assessment & Plan Note (Signed)
Triamcinolone dental paste ordered per patient request.  Prescription given for valacyclovir as needed for treatment when having outbreak.

## 2021-05-08 ENCOUNTER — Ambulatory Visit (HOSPITAL_COMMUNITY): Payer: Medicaid Other

## 2021-05-10 LAB — PROTIME-INR
INR: 1
Prothrombin Time: 10.3 s (ref 9.0–11.5)

## 2021-05-10 LAB — HEPATITIS B DNA, ULTRAQUANTITATIVE, PCR
Hepatitis B DNA (Calc): 3.14 Log IU/mL — ABNORMAL HIGH
Hepatitis B DNA: 1390 IU/mL — ABNORMAL HIGH

## 2021-05-10 LAB — HEPATITIS B E ANTIBODY: Hep B E Ab: REACTIVE — AB

## 2021-05-10 LAB — HEPATITIS B E ANTIGEN: Hep B E Ag: NONREACTIVE

## 2021-05-10 LAB — HEPATITIS A ANTIBODY, TOTAL: Hepatitis A AB,Total: REACTIVE — AB

## 2021-05-10 LAB — HEPATITIS B CORE ANTIBODY, IGM: Hep B C IgM: NONREACTIVE

## 2021-05-10 LAB — HEPATITIS DELTA ANTIBODY: Hepatitis D Ab, Total: NEGATIVE

## 2021-05-12 ENCOUNTER — Other Ambulatory Visit: Payer: Self-pay | Admitting: Family Medicine

## 2021-05-12 DIAGNOSIS — Z599 Problem related to housing and economic circumstances, unspecified: Secondary | ICD-10-CM

## 2021-05-12 NOTE — Progress Notes (Signed)
Late addition: patient had requested assistance due to financial strain from medical expenses such as upcoming ultrasound which he would have to pay several hundred dollars out of pocket. He was amenable to CCM referral. This was discussed during recent visit but I unfortunately forgot to place the order.

## 2021-06-24 ENCOUNTER — Telehealth: Payer: Self-pay | Admitting: Family Medicine

## 2021-06-24 NOTE — Telephone Encounter (Signed)
   Telephone encounter was:  Unsuccessful.  06/24/2021 Name: Juan Palmer MRN: [id number] DOB: [birth year]  Unsuccessful outbound call made today to assist with:  Financial Difficulties related to medical bills.  Outreach Attempt:  1st Attempt  Tried three times to call patient. I got a recording that the call could not be completed.   April Green Care Guide, Embedded Care Coordination [facility], Care Management Phone: [phone] Email: [email or web address]

## 2021-06-26 ENCOUNTER — Telehealth: Payer: Self-pay | Admitting: Family Medicine

## 2021-06-26 NOTE — Telephone Encounter (Signed)
   Telephone encounter was:  Unsuccessful.  06/26/2021 Name: Juan Palmer MRN: [id number] DOB: Feb 13, 1971  Unsuccessful outbound call made today to assist with:  Financial Difficulties related to medical expenses.  Outreach Attempt:  2nd Attempt  A HIPAA compliant voice message was left requesting a return call.  Instructed patient to call back at [phone].  April Green Care Guide, Embedded Care Coordination [facility], Care Management Phone: [phone] Email: [email or web address]

## 2021-06-30 ENCOUNTER — Telehealth: Payer: Self-pay | Admitting: Family Medicine

## 2021-06-30 NOTE — Telephone Encounter (Signed)
   Telephone encounter was:  Unsuccessful.  06/30/2021 Name: Juan Palmer MRN: [id number] DOB: 01/01/1971  Unsuccessful outbound call made today to assist with:  Financial Difficulties related to medical expenses.  Outreach Attempt:  3rd Attempt.  Referral closed unable to contact patient.  A HIPAA compliant voice message was left requesting a return call.  Instructed patient to call back at [phone].  April Green Care Guide, Embedded Care Coordination [facility], Care Management Phone: [phone] Email: [email or web address]

## 2021-08-14 ENCOUNTER — Ambulatory Visit: Payer: Medicaid Other | Admitting: Infectious Diseases

## 2021-10-22 ENCOUNTER — Other Ambulatory Visit: Payer: Self-pay

## 2021-10-22 ENCOUNTER — Ambulatory Visit
Admission: EM | Admit: 2021-10-22 | Discharge: 2021-10-22 | Disposition: A | Discharge status: Home / Self Care | Payer: Medicaid Other | Attending: Emergency Medicine | Admitting: Emergency Medicine

## 2021-10-22 DIAGNOSIS — J329 Chronic sinusitis, unspecified: Secondary | ICD-10-CM | POA: Diagnosis not present

## 2021-10-22 DIAGNOSIS — J029 Acute pharyngitis, unspecified: Secondary | ICD-10-CM | POA: Insufficient documentation

## 2021-10-22 DIAGNOSIS — J310 Chronic rhinitis: Secondary | ICD-10-CM | POA: Diagnosis present

## 2021-10-22 LAB — POCT RAPID STREP A (OFFICE): Rapid Strep A Screen: NEGATIVE

## 2021-10-22 MED ORDER — CETIRIZINE HCL 10 MG PO TABS: 10.0000 mg | ORAL_TABLET | Freq: Every day | ORAL | 2 refills | Status: DC

## 2021-10-22 MED ORDER — FLUTICASONE PROPIONATE 50 MCG/ACT NA SUSP: 2.0000 | Freq: Every day | NASAL | 0 refills | Status: DC

## 2021-10-22 MED ORDER — IBUPROFEN 400 MG PO TABS: 400.0000 mg | ORAL_TABLET | Freq: Three times a day (TID) | ORAL | 0 refills | Status: DC | PRN

## 2021-10-22 NOTE — Discharge Instructions (Addendum)
Your symptoms and physical exam findings are concerning for a viral respiratory infection.    You were tested for both COVID and influenza today because here in the urgent care setting, we do not have an available option for an individual influenza test.   The result of your viral testing will be posted to your MyChart once it is complete, this typically takes 24 to 48 hours.  If there is a positive result, you will be contacted by phone with further recommendations, if any.  If your COVID test is positive, we will be happy to send you a prescription for antiviral treatment.   Due to the duration of your symptoms, you would no longer benefit from antiviral therapy for influenza.  Conservative care is recommended at this time.  This includes rest, pushing clear fluids and activity as tolerated.  Warm beverages such as teas and broths versus cold beverages/popsicles and frozen sherbet/sorbet are your choice, both warm and cold are beneficial.  You may also notice that your appetite is reduced; this is okay as long as you are drinking plenty of clear fluids.     Your strep test today is negative.  Throat culture will be performed per our protocol.  The result of your throat culture will be posted to your MyChart once it is complete, this typically takes 3 to 5 days.  If there is a positive result, you will be contacted by phone and antibiotics will be prescribed for you.   Your symptoms and my physical exam findings are are concerning for exacerbation of possible underlying allergies.  I recommend that you initiate allergy medications at this time.  It is important that you are consistent with taking allergy medications exactly as prescribed.  Not doing so increases your risk of more frequent upper respiratory infections that may or may not require the use of antibiotics, serious exacerbations that require the use of oral steroids, loss of time at work and missed social opportunities.   Please see the list  below for recommended medications, dosages and frequencies to provide relief of your current symptoms:     Ibuprofen  (Advil, Motrin): This is a good anti-inflammatory medication which addresses aches, pains and inflammation of the upper airways that causes sinus and nasal congestion as well as in the lower airways which makes your cough feel tight and sometimes burn.  I recommend that you take between 400 to 600 mg every 6-8 hours as needed, I have provided you with a prescription for 400 mg.      Fluticasone (Flonase): This is a steroid nasal spray that you use once daily, 1 spray in each nare.  After 3 to 5 days of use, you will have significant improvement of the inflammation and mucus production that is being caused by exposure to allergens.  This medication can be purchased over-the-counter however I have provided you with a prescription.      Cetirizine (Zyrtec): This is an excellent second-generation antihistamine that helps to reduce respiratory inflammatory response to viruses and environmental allergens.  Please take 1 tablet daily at bedtime.   Conservative care is also recommended at this time.  This includes rest, pushing clear fluids and activity as tolerated.  Warm beverages such as teas and broths versus cold beverages/popsicles and frozen sherbet/sorbet are your choice, both warm and cold are beneficial.  You may also notice that your appetite is reduced; this is okay as long as you are drinking plenty of clear fluids.    Please  remain home from work, school, public places until you have been fever free for 24 hours without the use of antifever medications such as Tylenol or ibuprofen.    Please follow-up within the next 3 to 5 days either with your primary care provider or urgent care if your symptoms do not resolve.  If you do not have a primary care provider, we will assist you in finding one.

## 2021-10-22 NOTE — ED Provider Notes (Signed)
[facility]    CSN: [id number] Arrival date & time: 10/22/21  [id number]    HISTORY   Chief Complaint  Patient presents with   Cough   Sore Throat   HPI Juan Palmer is a 51 y.o. male. Pt c/o cough and sore throat that began 3 days ago.  Patient states his children at home have been sick but feels that his symptoms are different.  Patient states he has not had any body aches or chills, nausea, vomiting, diarrhea, headache.  Patient does endorse some runny nose and congestion.  Patient states the cough is minimal, nonproductive.  Patient states he not tried any treatment for his symptoms as of yet.  The history is provided by the patient.  History reviewed. No pertinent past medical history. 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 No pertinent surgical history.  Home Medications    Prior to Admission medications   Medication Sig Start Date End Date Taking? Authorizing Provider   Family History Family History  Problem Relation Age of Onset   Healthy Mother    Healthy Father    Social History Social History   Tobacco Use   Smoking status: Never   Smokeless tobacco: Never  Substance Use Topics   Alcohol use: No   Drug use: No   Allergies   Patient has no known allergies.  Review of Systems Review of Systems Pertinent findings noted in history of present illness.   Physical Exam Triage Vital Signs ED Triage Vitals  Enc Vitals Group     BP 07/11/21 0827 (!) 147/82     Pulse Rate 07/11/21 0827 72     Resp 07/11/21 0827 18     Temp 07/11/21 0827 98.3 F (36.8 C)     Temp Source 07/11/21 0827 Oral     SpO2 07/11/21 0827 98 %     Weight --      Height --      Head Circumference --      Peak Flow --      Pain Score 07/11/21 0826 5     Pain Loc --      Pain Edu? --      Excl. in [place]? --   No data found.  Updated Vital Signs BP (!) 105/57 (BP  Location: Left Arm)    Pulse 79    Temp 99.2 F (37.3 C) (Oral)    Resp 18    SpO2 96%   Physical Exam Vitals and nursing note reviewed.  Constitutional:      General: He is not in acute distress.    Appearance: Normal appearance. He is well-developed, well-groomed and normal weight. He is not ill-appearing.  HENT:     Head: Normocephalic and atraumatic.     Salivary Glands: Right salivary gland is not diffusely enlarged or tender. Left salivary gland is not diffusely enlarged or tender.     Right Ear: Ear canal and external ear normal. No drainage. A middle ear effusion is present. There is no impacted cerumen. Tympanic membrane is bulging. Tympanic membrane is not injected or erythematous.     Left Ear: Ear canal and external ear normal. No drainage. A middle ear effusion is present. There is no impacted cerumen. Tympanic membrane is bulging. Tympanic membrane is not injected or erythematous.     Ears:     Comments: Bilateral EACs  normal, both TMs bulging with clear fluid    Nose: Rhinorrhea present. No nasal deformity, septal deviation, signs of injury, nasal tenderness, mucosal edema or congestion. Rhinorrhea is clear.     Right Nostril: Occlusion present. No foreign body, epistaxis or septal hematoma.     Left Nostril: Occlusion present. No foreign body, epistaxis or septal hematoma.     Right Turbinates: Enlarged, swollen and pale.     Left Turbinates: Enlarged, swollen and pale.     Right Sinus: No maxillary sinus tenderness or frontal sinus tenderness.     Left Sinus: No maxillary sinus tenderness or frontal sinus tenderness.     Mouth/Throat:     Lips: Pink. No lesions.     Mouth: Mucous membranes are moist. No oral lesions.     Pharynx: Oropharynx is clear. Uvula midline. No posterior oropharyngeal erythema or uvula swelling.     Tonsils: No tonsillar exudate. 0 on the right. 0 on the left.     Comments: Postnasal drip Eyes:     General: Lids are normal.        Right eye: No  discharge.        Left eye: No discharge.     Extraocular Movements: Extraocular movements intact.     Conjunctiva/sclera: Conjunctivae normal.     Right eye: Right conjunctiva is not injected.     Left eye: Left conjunctiva is not injected.  Neck:     Trachea: Trachea and phonation normal.  Cardiovascular:     Rate and Rhythm: Normal rate and regular rhythm.     Pulses: Normal pulses.     Heart sounds: Normal heart sounds. No murmur heard.   No friction rub. No gallop.  Pulmonary:     Effort: Pulmonary effort is normal. No tachypnea, bradypnea, prolonged expiration or respiratory distress.     Breath sounds: Normal breath sounds. No stridor, decreased air movement or transmitted upper airway sounds. No decreased breath sounds, wheezing, rhonchi or rales.  Chest:     Chest wall: No tenderness.  Musculoskeletal:        General: Normal range of motion.     Cervical back: Normal range of motion and neck supple. Normal range of motion.  Lymphadenopathy:     Cervical: No cervical adenopathy.  Skin:    General: Skin is warm and dry.     Findings: No erythema or rash.  Neurological:     General: No focal deficit present.     Mental Status: He is alert and oriented to person, place, and time.  Psychiatric:        Mood and Affect: Mood normal.        Behavior: Behavior normal. Behavior is cooperative.    Visual Acuity Right Eye Distance:   Left Eye Distance:   Bilateral Distance:    Right Eye Near:   Left Eye Near:    Bilateral Near:     UC Couse / Diagnostics / Procedures:    EKG  Radiology No results found.  Procedures Procedures (including critical care time)  UC Diagnoses / Final Clinical Impressions(s)   I have reviewed the triage vital signs and the nursing notes.  Pertinent labs & imaging results that were available during my care of the patient were reviewed by me and considered in my medical decision making (see chart for details).   Final diagnoses:   Rhinosinusitis  Pharyngitis, unspecified etiology   Rapid strep test today was negative.  COVID flu tests are pending.  Conservative care recommended.  Patient provided with Zyrtec and Flonase given significant nasal congestion return precautions advised.  ED Prescriptions     Medication Sig Dispense Auth. Provider   fluticasone (FLONASE) 50 MCG/ACT nasal spray Place 2 sprays into both nostrils daily. 18 mL Lynden Oxford Scales, PA-C   cetirizine (ZYRTEC ALLERGY) 10 MG tablet Take 1 tablet (10 mg total) by mouth at bedtime. 30 tablet Lynden Oxford Scales, PA-C   ibuprofen (ADVIL) 400 MG tablet Take 1 tablet (400 mg total) by mouth every 8 (eight) hours as needed for up to 30 doses for fever, headache, mild pain or moderate pain. 30 tablet Lynden Oxford Scales, PA-C      PDMP not reviewed this encounter.  Pending results:  Labs Reviewed  COVID-19, FLU A+B NAA  CULTURE, GROUP A STREP [facility])  POCT RAPID STREP A (OFFICE)    Medications Ordered in UC: Medications - No data to display  Disposition Upon Discharge:  Condition: stable for discharge home Home: take medications as prescribed; routine discharge instructions as discussed; follow up as advised.  Patient presented with an acute illness with associated systemic symptoms and significant discomfort requiring urgent management. In my opinion, this is a condition that a prudent lay person (someone who possesses an average knowledge of health and medicine) may potentially expect to result in complications if not addressed urgently such as respiratory distress, impairment of bodily function or dysfunction of bodily organs.   Routine symptom specific, illness specific and/or disease specific instructions were discussed with the patient and/or caregiver at length.   As such, the patient has been evaluated and assessed, work-up was performed and treatment was provided in alignment with urgent care protocols and evidence based  medicine.  Patient/parent/caregiver has been advised that the patient may require follow up for further testing and treatment if the symptoms continue in spite of treatment, as clinically indicated and appropriate.  If the patient was tested for COVID-19, Influenza and/or RSV, then the patient/parent/guardian was advised to isolate at home pending the results of his/her diagnostic coronavirus test and potentially longer if theyre positive. I have also advised pt that if his/her COVID-19 test returns positive, it's recommended to self-isolate for at least 10 days after symptoms first appeared AND until fever-free for 24 hours without fever reducer AND other symptoms have improved or resolved. Discussed self-isolation recommendations as well as instructions for household member/close contacts as per the [facility] and [place] DHHS, and also gave patient the [place] packet with this information.  Patient/parent/caregiver has been advised to return to the [facility] or PCP in 3-5 days if no better; to PCP or the Emergency Department if new signs and symptoms develop, or if the current signs or symptoms continue to change or worsen for further workup, evaluation and treatment as clinically indicated and appropriate  The patient will follow up with their current PCP if and as advised. If the patient does not currently have a PCP we will assist them in obtaining one.   The patient may need specialty follow up if the symptoms continue, in spite of conservative treatment and management, for further workup, evaluation, consultation and treatment as clinically indicated and appropriate.  Patient/parent/caregiver verbalized understanding and agreement of plan as discussed.  All questions were addressed during visit.  Please see discharge instructions below for further details of plan.  Discharge Instructions:   Discharge Instructions      Your symptoms and physical exam findings are concerning for a viral respiratory infection.  You were tested for both COVID and influenza today because here in the urgent care setting, we do not have an available option for an individual influenza test.   The result of your viral testing will be posted to your MyChart once it is complete, this typically takes 24 to 48 hours.  If there is a positive result, you will be contacted by phone with further recommendations, if any.  If your COVID test is positive, we will be happy to send you a prescription for antiviral treatment.   Due to the duration of your symptoms, you would no longer benefit from antiviral therapy for influenza.  Conservative care is recommended at this time.  This includes rest, pushing clear fluids and activity as tolerated.  Warm beverages such as teas and broths versus cold beverages/popsicles and frozen sherbet/sorbet are your choice, both warm and cold are beneficial.  You may also notice that your appetite is reduced; this is okay as long as you are drinking plenty of clear fluids.     Your strep test today is negative.  Throat culture will be performed per our protocol.  The result of your throat culture will be posted to your MyChart once it is complete, this typically takes 3 to 5 days.  If there is a positive result, you will be contacted by phone and antibiotics will be prescribed for you.   Your symptoms and my physical exam findings are are concerning for exacerbation of possible underlying allergies.  I recommend that you initiate allergy medications at this time.  It is important that you are consistent with taking allergy medications exactly as prescribed.  Not doing so increases your risk of more frequent upper respiratory infections that may or may not require the use of antibiotics, serious exacerbations that require the use of oral steroids, loss of time at work and missed social opportunities.   Please see the list below for recommended medications, dosages and frequencies to provide relief of your current  symptoms:     Ibuprofen  (Advil, Motrin): This is a good anti-inflammatory medication which addresses aches, pains and inflammation of the upper airways that causes sinus and nasal congestion as well as in the lower airways which makes your cough feel tight and sometimes burn.  I recommend that you take between 400 to 600 mg every 6-8 hours as needed, I have provided you with a prescription for 400 mg.      Fluticasone (Flonase): This is a steroid nasal spray that you use once daily, 1 spray in each nare.  After 3 to 5 days of use, you will have significant improvement of the inflammation and mucus production that is being caused by exposure to allergens.  This medication can be purchased over-the-counter however I have provided you with a prescription.      Cetirizine (Zyrtec): This is an excellent second-generation antihistamine that helps to reduce respiratory inflammatory response to viruses and environmental allergens.  Please take 1 tablet daily at bedtime.   Conservative care is also recommended at this time.  This includes rest, pushing clear fluids and activity as tolerated.  Warm beverages such as teas and broths versus cold beverages/popsicles and frozen sherbet/sorbet are your choice, both warm and cold are beneficial.  You may also notice that your appetite is reduced; this is okay as long as you are drinking plenty of clear fluids.    Please remain home from work, school, public places until you have been fever free for 24 hours  without the use of antifever medications such as Tylenol or ibuprofen.    Please follow-up within the next 3 to 5 days either with your primary care provider or urgent care if your symptoms do not resolve.  If you do not have a primary care provider, we will assist you in finding one.       This office note has been dictated using Museum/gallery curator.  Unfortunately, and despite my best efforts, this method of dictation can sometimes lead to  occasional typographical or grammatical errors.  I apologize in advance if this occurs.     Lynden Oxford Scales, PA-C 10/22/21 1347

## 2021-10-22 NOTE — ED Triage Notes (Signed)
Pt c/o cough and sore throat. Started: yesterday

## 2021-10-23 LAB — COVID-19, FLU A+B NAA
Influenza A, NAA: NOT DETECTED
Influenza B, NAA: NOT DETECTED
SARS-CoV-2, NAA: NOT DETECTED

## 2021-10-24 LAB — CULTURE, GROUP A STREP (THRC)

## 2021-11-19 ENCOUNTER — Other Ambulatory Visit: Payer: Self-pay

## 2021-11-19 ENCOUNTER — Ambulatory Visit (HOSPITAL_COMMUNITY)
Admission: RE | Admit: 2021-11-19 | Discharge: 2021-11-19 | Disposition: A | Discharge status: Home / Self Care | Payer: Medicaid Other | Source: Ambulatory Visit | Attending: Infectious Diseases | Admitting: Infectious Diseases

## 2021-11-19 DIAGNOSIS — B181 Chronic viral hepatitis B without delta-agent: Secondary | ICD-10-CM | POA: Diagnosis present

## 2021-11-24 ENCOUNTER — Other Ambulatory Visit: Payer: Self-pay

## 2021-11-24 ENCOUNTER — Ambulatory Visit (INDEPENDENT_AMBULATORY_CARE_PROVIDER_SITE_OTHER): Payer: Medicaid Other | Admitting: Family Medicine

## 2021-11-24 VITALS — BP 94/60 | HR 76 | Ht 74.0 in | Wt 123.5 lb

## 2021-11-24 DIAGNOSIS — Z8619 Personal history of other infectious and parasitic diseases: Secondary | ICD-10-CM | POA: Diagnosis not present

## 2021-11-24 DIAGNOSIS — H6983 Other specified disorders of Eustachian tube, bilateral: Secondary | ICD-10-CM

## 2021-11-24 DIAGNOSIS — R002 Palpitations: Secondary | ICD-10-CM

## 2021-11-24 DIAGNOSIS — K219 Gastro-esophageal reflux disease without esophagitis: Secondary | ICD-10-CM

## 2021-11-24 MED ORDER — FAMOTIDINE 20 MG PO TABS: 20.0000 mg | ORAL_TABLET | Freq: Two times a day (BID) | ORAL | 0 refills | Status: DC | PRN

## 2021-11-24 MED ORDER — FLUTICASONE PROPIONATE 50 MCG/ACT NA SUSP: 2.0000 | Freq: Every day | NASAL | 0 refills | Status: DC

## 2021-11-24 NOTE — Assessment & Plan Note (Addendum)
Reports intermittent symptoms, not currently flaring up.  He reports history of H. pylori which was treated in [place]. ?- counseled on lifestyle changes ?- famotidine prn ?

## 2021-11-24 NOTE — Patient Instructions (Addendum)
It was nice seeing you today! ? ?Try Flonase for ear pain. ? ?I am referring you to the cardiologist for your irregular heartbeat sensation. ? ?Follow-up if not improving. ? ?Stay well, ?Zola Button, MD ?[place] ?[phone] ? ?-- ? ?Make sure to check out at the front desk before you leave today. ? ?Please arrive at least 15 minutes prior to your scheduled appointments. ? ?If you had blood work today, I will send you a MyChart message or a letter if results are normal. Otherwise, I will give you a call. ? ?If you had a referral placed, they will call you to set up an appointment. Please give [place] a call if you don't hear back in the next 2 weeks. ? ?If you need additional refills before your next appointment, please call your pharmacy first.  ?

## 2021-11-24 NOTE — Progress Notes (Signed)
? ? ?  SUBJECTIVE:  ? ?CHIEF COMPLAINT / HPI:  ?No chief complaint on file. ?  ?Patient reports intermittent palpitations.  These were occurring very rarely but are now occurring more frequently, about 1-2 times per week.  She reports occasional chest discomfort as well which lasts for less than 30 minutes and is not associated with exertion.  Denies shortness of breath. ? ?He has also had bilateral ear discomfort worse in the left ear ongoing for 1 to 2 weeks.  He has had a sore throat for the past few days which is improved this morning.  Denies recent swimming or water exposure.  Denies ear drainage, hearing loss, fever, chills.  He was seen in urgent care 1 month ago for upper respiratory symptoms diagnosed with rhinosinusitis. ? ?He reports issues reports issues with acid reflux and heartburn last month.  This has improved significantly and is not having any issues currently.  He is wondering if there is any he can take as needed for this. ? ?PERTINENT  PMH / PSH: Chronic hepatitis B, HSV 1, H. pylori ? ?Patient Care Team: ?Littie Deeds, MD as PCP - General (Family Medicine)  ? ?OBJECTIVE:  ? ?BP 94/60   Pulse 76   Ht 6\' 2"  (1.88 m)   Wt 123 lb 8 oz (56 kg)   SpO2 97%   BMI 15.86 kg/m?   ?Physical Exam ?Constitutional:   ?   General: He is not in acute distress. ?HENT:  ?   Head: Normocephalic and atraumatic.  ?   Right Ear: Tympanic membrane, ear canal and external ear normal. No tenderness.  ?   Left Ear: Tympanic membrane, ear canal and external ear normal. No tenderness.  ?   Ears:  ?   Comments: No tenderness with manipulation of the outer ear or tragus. ?Cardiovascular:  ?   Rate and Rhythm: Normal rate and regular rhythm.  ?   Heart sounds: Normal heart sounds. No murmur heard. ?Pulmonary:  ?   Effort: Pulmonary effort is normal. No respiratory distress.  ?   Breath sounds: Normal breath sounds.  ?Neurological:  ?   Mental Status: He is alert.  ?  ? ?Depression screen [facility] 2/9 11/24/2021  ?Decreased  Interest 0  ?Down, Depressed, Hopeless 0  ?PHQ - 2 Score 0  ?Altered sleeping 0  ?Tired, decreased energy 1  ?Change in appetite 0  ?Feeling bad or failure about yourself  0  ?Trouble concentrating 0  ?Moving slowly or fidgety/restless 0  ?Suicidal thoughts 0  ?PHQ-9 Score 1  ?  ?EKG - normal sinus rhythm, early repolarization in anterior leads most notable in V3 and V4, questionable p pulmonale ?{Show previous vital signs (optional):23777} ? ? ? ?ASSESSMENT/PLAN:  ? ?Palpitations ?Intermittent, occurring 1-2 times/week. EKG without any arrhythmias or ischemic changes. Consider possible paroxysmal arrhythmia not captured on EKG. ?- referral to cardiology for possible long term monitoring ? ?Eustachian tube dysfunction ?No evidence of otitis media or externa on exam or history. Possible Eustachian tube dysfunction in light of recent viral illness. ?- Flonase ? ?GERD (gastroesophageal reflux disease) ?Reports intermittent symptoms, not currently flaring up.  He reports history of H. pylori which was treated in 11/26/2021. ?- counseled on lifestyle changes ?- famotidine prn ?  ? ?Return in about 6 months (around 05/27/2022) for physical.  ? ?05/29/2022, MD ?[facility]  ?

## 2021-11-26 ENCOUNTER — Ambulatory Visit (INDEPENDENT_AMBULATORY_CARE_PROVIDER_SITE_OTHER): Payer: Medicaid Other | Admitting: Infectious Diseases

## 2021-11-26 ENCOUNTER — Other Ambulatory Visit: Payer: Self-pay

## 2021-11-26 ENCOUNTER — Encounter: Payer: Self-pay | Admitting: Infectious Diseases

## 2021-11-26 VITALS — BP 92/53 | HR 56 | Temp 97.9°F | Wt 124.0 lb

## 2021-11-26 DIAGNOSIS — B181 Chronic viral hepatitis B without delta-agent: Secondary | ICD-10-CM | POA: Diagnosis not present

## 2021-11-26 DIAGNOSIS — Z789 Other specified health status: Secondary | ICD-10-CM | POA: Insufficient documentation

## 2021-11-26 DIAGNOSIS — Z129 Encounter for screening for malignant neoplasm, site unspecified: Secondary | ICD-10-CM | POA: Diagnosis not present

## 2021-11-26 HISTORY — DX: Other specified health status: Z78.9

## 2021-11-26 NOTE — Progress Notes (Signed)
?Cardiology Office Note:   ? ?Date:  11/27/2021  ? ?ID:  Juan Palmer, DOB [birth year], MRN [id number] ? ?PCP:  Juan Deeds, MD  ? ?[facility] Providers ?Cardiologist:  Juan Skeans, MD ?Referring MD: Juan Manners, MD  ? ?Chief Complaint/Reason for Referral: Palpitations ? ?ASSESSMENT:   ? ?1. Palpitations   ? ? ?PLAN:   ? ?In order of problems listed above: ?1.  We will check reflex TSH, echocardiogram, ETT, and monitor.  We will keep follow-up open-ended depending on the results of this testing. ? ? ?     ? ?Shared Decision Making/Informed Consent ?The risks [chest pain, shortness of breath, cardiac arrhythmias, dizziness, blood pressure fluctuations, myocardial infarction, stroke/transient ischemic attack, and life-threatening complications (estimated to be 1 in 10,000)], benefits (risk stratification, diagnosing coronary artery disease, treatment guidance) and alternatives of an exercise tolerance test were discussed in detail with Juan Palmer and he agrees to proceed.  ? ?Dispo:  Return if symptoms worsen or fail to improve.  ? ?  ? ?Medication Adjustments/Labs and Tests Ordered: ?Current medicines are reviewed at length with the patient today.  Concerns regarding medicines are outlined above.  ? ?Tests Ordered: ?No orders of the defined types were placed in this encounter. ? ? ?Medication Changes: ?No orders of the defined types were placed in this encounter. ? ? ?History of Present Illness:   ? ?FOCUSED PROBLEM LIST:   ?1.  GERD ? ?The patient is a 51 y.o. male with the indicated medical history here for recommendations regarding palpitations.  The patient was seen by her primary care provider recently.  She notes that she has had increasing frequency of palpitations that occur maybe 1-2 times a week.  They are associated with occasional chest discomfort.  They last less than 30 minutes.  There is no associated shortness of breath.  EKG demonstrates sinus rhythm with J-point elevation. ? ?The patient  tells me that he has had palpitations maybe once a week.  They can occur for several hours.  They are associated with some chest discomfort.  He occasionally gets chest discomfort when he walks but not reliably so.  He denies any shortness of breath, severe bleeding, paroxysmal atrial dyspnea, orthopnea, or peripheral edema.  He has required no hospitalizations or emergency room visits.  He does not believe that this chest pain is related to food intake but developed nausea in response to certain foods. ?  ?Previous Medical History: ?History reviewed. No pertinent past medical history. ? ? ?Current Medications: ?Current Meds  ?Medication Sig  ? fluticasone (FLONASE) 50 MCG/ACT nasal spray Place 2 sprays into both nostrils daily.  ?  ? ?Allergies:    ?Patient has no known allergies.  ? ?Social History:   ?Social History  ? ?Tobacco Use  ? Smoking status: Never  ? Smokeless tobacco: Never  ?Substance Use Topics  ? Alcohol use: No  ? Drug use: No  ?  ? ?Family Hx: ?Family History  ?Problem Relation Age of Onset  ? Healthy Mother   ? Healthy Father   ?  ? ?Review of Systems:   ?Please see the history of present illness.    ?All other systems reviewed and are negative. ?  ? ? ?EKGs/Labs/Other Test Reviewed:   ? ?EKG: March 2023 sinus rhythm with J-point elevation ? ?Prior CV studies: ?None available ? ?Imaging studies that I have independently reviewed today: CT chest without coronary calcification or aortic atherosclerosis. ? ?Recent Labs: ?04/23/2021: BUN 17; Creatinine, Ser  0.95; Hemoglobin 14.0; Magnesium 2.1; Platelets 231; Potassium 4.8; Sodium 138; TSH 0.849 ?11/26/2021: ALT 20  ? ?Recent Lipid Panel ?Lab Results  ?Component Value Date/Time  ? CHOL 200 (H) 04/23/2021 10:31 AM  ? TRIG 65 04/23/2021 10:31 AM  ? HDL 63 04/23/2021 10:31 AM  ? LDLCALC 125 (H) 04/23/2021 10:31 AM  ? ? ?Risk Assessment/Calculations:   ? ? ?    ? ?Physical Exam:   ? ?VS:  BP (!) 86/50   Pulse 69   Ht 6\' 2"  (1.88 m)   Wt 127 lb (57.6 kg)    SpO2 99%   BMI 16.31 kg/m?    ?Wt Readings from Last 3 Encounters:  ?11/27/21 127 lb (57.6 kg)  ?11/26/21 124 lb (56.2 kg)  ?11/24/21 123 lb 8 oz (56 kg)  ?  ?GENERAL:  No apparent distress, AOx3 ?HEENT:  No carotid bruits, +2 carotid impulses, no scleral icterus ?CAR: RRR no murmurs, gallops, rubs, or thrills ?RES:  Clear to auscultation bilaterally ?ABD:  Soft, nontender, nondistended, positive bowel sounds x 4 ?VASC:  +2 radial pulses, +2 carotid pulses, palpable pedal pulses ?NEURO:  CN 2-12 grossly intact; motor and sensory grossly intact ?PSYCH:  No active depression or anxiety ?EXT:  No edema, ecchymosis, or cyanosis ? ?Signed, ?11/26/21, MD  ?11/27/2021 2:23 PM    ?[facility] ?[address] [place], [place], [place]  [place] ?Phone: [phone]; Fax: [phone]  ? ?Note:  This document was prepared using Dragon voice recognition software and may include unintentional dictation errors. ?

## 2021-11-26 NOTE — Progress Notes (Signed)
[facility]  ?                                    [address], [place], [place], [address] ?                                              Phn. [phone]; Fax: [phone] ?                                                              Date 11/26/21 ?Reason for Visit: Hepatitis B Follow Up  ? ?Subjective  ?Here for follow up for hepatitis B. Denies any complaints. Denies any nausea, vomiting, abdominal pain and jaundice. Appetite is good. He recently had an US abdomen done with unremarkable findings. He is tired today as he has not slept well because of hectic work last week. He is curious as to why he is not started on tx for Hepatitis B. No concerns otherwise.  ? ?ROS: Denies yellowish discoloration of sclera and skin, abdominal pain/distension, hematemesis.  ?          Denies cough, fever, chills, nightsweats, nausea, vomiting, diarrhea, constipation, weight loss, recent hospitalizations, rashes, joint complaints, shortness of breath, chest pain, headaches, dysuria . ? ?Current Outpatient Medications on File Prior to Visit  ?Medication Sig Dispense Refill  ? cetirizine (ZYRTEC ALLERGY) 10 MG tablet Take 1 tablet (10 mg total) by mouth at bedtime. 30 tablet 2  ? famotidine (PEPCID) 20 MG tablet Take 1 tablet (20 mg total) by mouth 2 (two) times daily as needed for heartburn or indigestion. 60 tablet 0  ? fluticasone (FLONASE) 50 MCG/ACT nasal spray Place 2 sprays into both nostrils daily. 18 mL 0  ? ?No current facility-administered medications on file prior to visit.  ? ? ?No Known Allergies ? ?No past medical history on file. ? ?No past surgical history on file. ? ?Social History  ? ?Socioeconomic History  ? Marital status: Married  ?  Spouse name: Not on file  ? Number of children: Not on file  ? Years of education: Not on file  ? Highest education level: Not on file  ?Occupational History  ? Not on file  ?Tobacco Use  ? Smoking  status: Never  ? Smokeless tobacco: Never  ?Substance and Sexual Activity  ? Alcohol use: No  ? Drug use: No  ? Sexual activity: Not on file  ?Other Topics Concern  ? Not on file  ?Social History Narrative  ? Not on file  ? ?Social Determinants of Health  ? ?Financial Resource Strain: Not on file  ?Food Insecurity: Not on file  ?Transportation Needs: Not on file  ?Physical Activity: Not on file  ?Stress: Not on file  ?Social Connections: Not on file  ?Intimate Partner Violence: Not on file  ? ?Family History  ?Problem Relation Age of Onset  ? Healthy Mother   ? Healthy Father   ? ?Vitals  ?BP (!) 92/53   Pulse (!) 56   Temp 97.9 ?F (36.6 ?C) (Temporal)   Wt 124  lb (56.2 kg)   BMI 15.92 kg/m?  ? ? ?Gen: Alert and oriented x 3, no acute distress ?HEENT: [place]/AT, no scleral icterus, no pale conjunctivae, hearing normal, oral mucosa moist ?Neck: Supple ?Cardio: Regular rate and rhythm ?Resp: Pulmonary effort normal on room air  ?GI: Soft, nontender, non-distended ?Extremities: No pedal edema ?Neuro: grossly non focal, awake, alert and oriented * 3  ?Psych: Calm, cooperative ? ? ?Laboratory  ?CBC Latest Ref Rng & Units 04/23/2021 08/22/2010 07/18/2010  ?WBC 3.4 - 10.8 x10E3/uL 3.7 5.8 3.6(L)  ?Hemoglobin 13.0 - 17.7 g/dL 51.7 00.1 74.9  ?Hematocrit 37.5 - 51.0 % 42.6 43.8 41.7  ?Platelets 150 - 450 x10E3/uL 231 239 258  ? ?CMP Latest Ref Rng & Units 04/23/2021 08/22/2010 07/18/2010  ?Glucose 65 - 99 mg/dL 87 449(Q) 759(F)  ?BUN 6 - 24 mg/dL 17 15 12   ?Creatinine 0.76 - 1.27 mg/dL 6.38 4.66  ?Sodium 134 - 144 mmol/L 138 137 136  ?Potassium 3.5 - 5.2 mmol/L 4.8 4.1 3.9  ?Chloride 96 - 106 mmol/L 101 100 99  ?CO2 20 - 29 mmol/L 26 27 27   ?Calcium 8.7 - 10.2 mg/dL 9.6 9.1 8.8  ?Total Protein 6.0 - 8.5 g/dL 7.1 7.3 7.4  ?Total Bilirubin 0.0 - 1.2 mg/dL 0.5 0.6 0.5  ?Alkaline Phos 44 - 121 IU/L 56 53 55  ?AST 0 - 40 IU/L 29 25 28   ?ALT 0 - 44 IU/L 24 20 20   ? ?Problem List Items Addressed This Visit   ? ?  ? Digestive  ?  Hepatitis B - Primary  ? Relevant Orders  ? Hepatitis B DNA, ultraquantitative, PCR  ? Hepatitis B surface antigen  ? ALT  ? Liver Fibrosis, FibroTest-ActiTest  ?  ? Other  ? Screening for cancer  ? Relevant Orders  ? 5.99 Abdomen Complete  ? Hepatitis A immune  ? ? ?Assessment/Plan: ?# Chronic Hepatitis B ?- Labs today  ?- Fu in 6 months  ?Orders Placed This Encounter  ?Procedures  ? Abdomen Complete  ?  Standing Status:   Future  ?  Standing Expiration Date:   11/27/2022  ?  Order Specific Question:   Reason for Exam (SYMPTOM  OR DIAGNOSIS REQUIRED)  ?  Answer:   HCC Screening  ?  Order Specific Question:   Preferred imaging location?  ?  Answer:    ? Hepatitis B DNA, ultraquantitative, PCR  ? Hepatitis B surface antigen  ? ALT  ? Liver Fibrosis, FibroTest-ActiTest  ? ?# HCC screening  ?- Risk factor - Hepatitis B in an [place] male age more than 40 years  ?- US abdomen due in September 2023 ? ?# Vaccination  ?- He is immune to Hepatitis A ? ?I spent 45 minutes with the patient including review of prior medical records with greater than 50% of time in face to face counsel of the patient.  ? ?Electronically signed by: ? ?Redge Gainer, MD ?Infectious Diseases  ?Office phone [phone] ?Fax no. [phone] ? ?

## 2021-11-27 ENCOUNTER — Ambulatory Visit (INDEPENDENT_AMBULATORY_CARE_PROVIDER_SITE_OTHER): Payer: Medicaid Other

## 2021-11-27 ENCOUNTER — Encounter: Payer: Self-pay | Admitting: Internal Medicine

## 2021-11-27 ENCOUNTER — Ambulatory Visit (INDEPENDENT_AMBULATORY_CARE_PROVIDER_SITE_OTHER): Payer: Medicaid Other | Admitting: Internal Medicine

## 2021-11-27 VITALS — BP 86/50 | HR 69 | Ht 74.0 in | Wt 127.0 lb

## 2021-11-27 DIAGNOSIS — R002 Palpitations: Secondary | ICD-10-CM

## 2021-11-27 NOTE — Patient Instructions (Signed)
Medication Instructions:  ?No changes ?*If you need a refill on your cardiac medications before your next appointment, please call your pharmacy* ? ? ?Lab Work: ?Reflex TSH ? ?If you have labs (blood work) drawn today and your tests are completely normal, you will receive your results only by: ?MyChart Message (if you have MyChart) OR ?A paper copy in the mail ?If you have any lab test that is abnormal or we need to change your treatment, we will call you to review the results. ? ? ?Testing/Procedures: ?Your physician has requested that you have an exercise tolerance test. For further information please visit [email or web address] Please also follow instruction sheet, as given. ?Your physician has requested that you have an echocardiogram. Echocardiography is a painless test that uses sound waves to create images of your heart. It provides your doctor with information about the size and shape of your heart and how well your heart?s chambers and valves are working. This procedure takes approximately one hour. There are no restrictions for this procedure. ? ?Zio Heart Monitor (Patch) - 7 days ? ?Follow-Up: ?As needed ? ?Other Instructions ?  ?

## 2021-11-27 NOTE — Progress Notes (Unsigned)
[id number] 7 day ZIO XT from office inventory applied to patient. ?

## 2021-11-28 LAB — TSH RFX ON ABNORMAL TO FREE T4: TSH: 0.908 u[IU]/mL (ref 0.450–4.500)

## 2021-12-03 ENCOUNTER — Other Ambulatory Visit (HOSPITAL_COMMUNITY): Payer: Medicaid Other

## 2021-12-03 NOTE — Addendum Note (Signed)
Addended by: Lendon Ka on: 12/03/2021 09:58 AM ? ? Modules accepted: Orders ? ?

## 2021-12-03 NOTE — Addendum Note (Signed)
Addended by: Alverda Skeans on: 12/03/2021 10:20 AM ? ? Modules accepted: Orders ? ?

## 2021-12-04 LAB — HEPATITIS B DNA, ULTRAQUANTITATIVE, PCR
Hepatitis B DNA (Calc): 1.48 Log IU/mL — ABNORMAL HIGH
Hepatitis B DNA: 31 IU/mL — ABNORMAL HIGH

## 2021-12-04 LAB — LIVER FIBROSIS, FIBROTEST-ACTITEST
ALT: 21 U/L (ref 9–46)
Alpha-2-Macroglobulin: 151 mg/dL (ref 106–279)
Apolipoprotein A1: 174 mg/dL (ref 94–176)
Bilirubin: 0.6 mg/dL (ref 0.2–1.2)
Fibrosis Score: 0.15
GGT: 34 U/L (ref 3–95)
Haptoglobin: 88 mg/dL (ref 43–212)
Necroinflammat ACT Score: 0.07
Reference ID: 4282866

## 2021-12-04 LAB — ALT: ALT: 20 U/L (ref 9–46)

## 2021-12-04 LAB — HEPATITIS B SURFACE ANTIGEN: Hepatitis B Surface Ag: REACTIVE — AB

## 2021-12-10 ENCOUNTER — Encounter: Payer: Self-pay | Admitting: Family Medicine

## 2021-12-10 ENCOUNTER — Ambulatory Visit (INDEPENDENT_AMBULATORY_CARE_PROVIDER_SITE_OTHER): Payer: Medicaid Other | Admitting: Family Medicine

## 2021-12-10 VITALS — BP 93/57 | HR 61 | Wt 123.0 lb

## 2021-12-10 DIAGNOSIS — Z8619 Personal history of other infectious and parasitic diseases: Secondary | ICD-10-CM | POA: Diagnosis not present

## 2021-12-10 DIAGNOSIS — L80 Vitiligo: Secondary | ICD-10-CM

## 2021-12-10 DIAGNOSIS — K219 Gastro-esophageal reflux disease without esophagitis: Secondary | ICD-10-CM | POA: Diagnosis present

## 2021-12-10 NOTE — Assessment & Plan Note (Signed)
Consider switching to PPI pending H. pylori test results.  Wondering if cough, sore throat, and ear pain are not related to acid reflux. ?

## 2021-12-10 NOTE — Assessment & Plan Note (Signed)
Retesting given symptoms.  Stool test as he is fasting for Ramadan. ?

## 2021-12-10 NOTE — Progress Notes (Signed)
? ? ?SUBJECTIVE:  ? ?CHIEF COMPLAINT / HPI:  ?Chief Complaint  ?Patient presents with  ? Ear Pain  ?  ?Last visit was having bilateral ear discomfort worse in the left for 1-2 weeks with prior URI about 1 month prior. Felt to be eustachian tube dysfunction at last visit treated with Flonase. ? ?Today, he reports continued bilateral ear discomfort though worse on the right this time.  He has also had a persistent sore throat since last visit.  He has tried Flonase without significant relief.  He also developed a cough 2 days ago.  He reports that he was around someone who was coughing several days ago.  Denies fever, chills, diarrhea.  He has had issues with acid reflux and abdominal pain which he reports have improved with as needed famotidine.  He is currently fasting for Ramadan. ? ?He also reports bilateral knee pain worse on the right that started about 1 week ago.  He has not taken any medicine for this nor does he want any medication to treat this.  He is frequently knees in prayer 20 to 30 minutes at a time.  No known injury. ? ?He also reports history of vitiligo and is requesting referral for dermatologist.  He was previously followed by dermatologist in [place].  He has developed new spots on his lip and nose. ? ?PERTINENT  PMH / PSH: H. pylori s/p treatment, GERD ? ?Patient Care Team: ?Littie Deeds, MD as PCP - General (Family Medicine)  ? ?OBJECTIVE:  ? ?BP (!) 93/57   Pulse 61   Wt 123 lb (55.8 kg)   BMI 15.79 kg/m?   ?Physical Exam ?Constitutional:   ?   General: He is not in acute distress. ?HENT:  ?   Right Ear: Ear canal and external ear normal.  ?   Left Ear: Tympanic membrane, ear canal and external ear normal.  ?   Ears:  ?   Comments: Right TM appears retracted. ?   Nose:  ?   Comments: No maxillary or frontal sinus tenderness. ?   Mouth/Throat:  ?   Mouth: Mucous membranes are moist.  ?   Pharynx: Oropharynx is clear. No oropharyngeal exudate or posterior oropharyngeal erythema.   ?Cardiovascular:  ?   Rate and Rhythm: Normal rate and regular rhythm.  ?   Heart sounds: Normal heart sounds.  ?Pulmonary:  ?   Effort: Pulmonary effort is normal. No respiratory distress.  ?   Breath sounds: Normal breath sounds.  ?Abdominal:  ?   Palpations: Abdomen is soft.  ?   Tenderness: There is no abdominal tenderness.  ?Musculoskeletal:  ?   Cervical back: Neck supple.  ?   Comments: Bilateral knees with no obvious deformity.  There is mild tenderness to palpation near the insertion point of the right quadriceps tendon medial aspect.  Full range of motion with extension and flexion with crepitus felt bilaterally.  Normal varus/valgus stress testing.  Negative anterior/posterior drawer.  Negative McMurray.  ?Skin: ?   Comments: Hypopigmented macules noted on the lower lip, nose, and right wrist.  ?Neurological:  ?   Mental Status: He is alert.  ?  ? ? ?  12/10/2021  ?  2:23 PM  ?Depression screen PHQ 2/9  ?Decreased Interest 0  ?Down, Depressed, Hopeless 0  ?PHQ - 2 Score 0  ?Altered sleeping 0  ?Tired, decreased energy 1  ?Change in appetite 0  ?Feeling bad or failure about yourself  0  ?Trouble concentrating 0  ?  Moving slowly or fidgety/restless 0  ?Suicidal thoughts 0  ?PHQ-9 Score 1  ?Difficult doing work/chores Not difficult at all  ?  ? ?{Show previous vital signs (optional):23777} ? ? ? ?ASSESSMENT/PLAN:  ? ?Bilateral ear pain ?Not improved with intranasal corticosteroids.  I do wonder if it is related to sore throat related to undertreated GERD, see below.  May need to consider ENT referral if not improving. ? ?History of Helicobacter pylori infection ?Retesting given symptoms.  Stool test as he is fasting for Ramadan. ? ?GERD (gastroesophageal reflux disease) ?Consider switching to PPI pending H. pylori test results.  Wondering if cough, sore throat, and ear pain are not related to acid reflux. ? ?Vitiligo ?Referral to dermatology per patient request. ?  ?Bilateral knee pain ?Possible OA or  bursitis given frequent kneeling.  Advised to use cushioning when doing prior.  Consider imaging if this is a persistent issue. ? ?Return in about 3 months (around 03/12/2022) for f/u GERD.  ? ?Littie Deeds, MD ?[facility]  ?

## 2021-12-10 NOTE — Assessment & Plan Note (Signed)
-   Referral to dermatology per patient request.

## 2021-12-10 NOTE — Patient Instructions (Addendum)
It was nice seeing you today! ? ?Referral to dermatology. ? ?Breath test for H Pylori today. ? ?Try to use more cushion when you are kneeling down. ? ?Stay well, ?Zola Button, MD ?[facility] ?[phone] ? ?-- ? ?Make sure to check out at the front desk before you leave today. ? ?Please arrive at least 15 minutes prior to your scheduled appointments. ? ?If you had blood work today, I will send you a MyChart message or a letter if results are normal. Otherwise, I will give you a call. ? ?If you had a referral placed, they will call you to set up an appointment. Please give [place] a call if you don't hear back in the next 2 weeks. ? ?If you need additional refills before your next appointment, please call your pharmacy first.  ?

## 2021-12-17 ENCOUNTER — Other Ambulatory Visit: Payer: Self-pay | Admitting: Family Medicine

## 2021-12-18 ENCOUNTER — Ambulatory Visit (HOSPITAL_COMMUNITY): Payer: Medicaid Other | Attending: Cardiovascular Disease

## 2021-12-18 ENCOUNTER — Ambulatory Visit (INDEPENDENT_AMBULATORY_CARE_PROVIDER_SITE_OTHER): Payer: Medicaid Other

## 2021-12-18 DIAGNOSIS — R002 Palpitations: Secondary | ICD-10-CM | POA: Diagnosis present

## 2021-12-18 LAB — ECHOCARDIOGRAM COMPLETE
Area-P 1/2: 3.08 cm2
MV M vel: 4.61 m/s
MV Peak grad: 85 mmHg
Radius: 0.4 cm
S' Lateral: 2.8 cm

## 2021-12-18 LAB — EXERCISE TOLERANCE TEST
Angina Index: 0
Duke Treadmill Score: 12
Estimated workload: 13.4
Exercise duration (min): 12 min
Exercise duration (sec): 0 s
MPHR: 171 {beats}/min
Peak HR: 120 {beats}/min
Percent HR: 70 %
RPE: 16
Rest HR: 52 {beats}/min
ST Depression (mm): 0 mm

## 2021-12-20 LAB — H. PYLORI ANTIGEN, STOOL: H pylori Ag, Stl: POSITIVE — AB

## 2021-12-22 ENCOUNTER — Encounter: Payer: Self-pay | Admitting: Family Medicine

## 2021-12-23 ENCOUNTER — Other Ambulatory Visit: Payer: Self-pay | Admitting: Family Medicine

## 2021-12-23 ENCOUNTER — Telehealth: Payer: Self-pay | Admitting: Family Medicine

## 2021-12-23 MED ORDER — OMEPRAZOLE 20 MG PO CPDR: 20.0000 mg | DELAYED_RELEASE_CAPSULE | Freq: Two times a day (BID) | ORAL | 0 refills | Status: DC

## 2021-12-23 MED ORDER — PYLERA 140-125-125 MG PO CAPS: 3.0000 | ORAL_CAPSULE | Freq: Four times a day (QID) | ORAL | 0 refills | Status: DC

## 2021-12-23 NOTE — Telephone Encounter (Signed)
Called patient to inform him that his H. pylori stool antigen test was positive.  We will go ahead and proceed with treatment with bismuth quadruple therapy for 14 days.  He still has 1 week in the past so he will plan to start treatment after his fast is over.  I advised that we will need to retest him 1 month after he completes treatment. ?

## 2021-12-24 MED ORDER — DOXYCYCLINE HYCLATE 100 MG PO TABS
100.0000 mg | ORAL_TABLET | Freq: Two times a day (BID) | ORAL | 0 refills | Status: AC
Start: 2021-12-24 — End: 2022-01-07

## 2021-12-24 MED ORDER — METRONIDAZOLE 500 MG PO TABS: 500.0000 mg | ORAL_TABLET | Freq: Four times a day (QID) | ORAL | 0 refills | Status: AC

## 2022-01-05 ENCOUNTER — Encounter: Payer: Self-pay | Admitting: Family Medicine

## 2022-01-05 ENCOUNTER — Ambulatory Visit (INDEPENDENT_AMBULATORY_CARE_PROVIDER_SITE_OTHER): Payer: Medicaid Other | Admitting: Family Medicine

## 2022-01-05 VITALS — BP 94/54 | HR 68 | Wt 127.1 lb

## 2022-01-05 DIAGNOSIS — A048 Other specified bacterial intestinal infections: Secondary | ICD-10-CM

## 2022-01-05 DIAGNOSIS — H9203 Otalgia, bilateral: Secondary | ICD-10-CM | POA: Diagnosis not present

## 2022-01-05 DIAGNOSIS — M25561 Pain in right knee: Secondary | ICD-10-CM | POA: Diagnosis present

## 2022-01-05 DIAGNOSIS — M25562 Pain in left knee: Secondary | ICD-10-CM

## 2022-01-05 MED ORDER — DICLOFENAC SODIUM 1 % EX GEL: 2.0000 g | Freq: Four times a day (QID) | CUTANEOUS | 1 refills | Status: DC

## 2022-01-05 NOTE — Patient Instructions (Addendum)
Please call the pharmacy and inquire about this medication as it is needed to treat H pylori infection :  Bismuth Subsalicylate  ? ?Go to one of the below addresses to get your knee x-rays  ? ?A ?[facility] ?[address] ? In [facility] ? [phone] ? ? ?B ?[facility] ?[address] ? [phone] ? ? ?Regarding your throat and ear pain : be sure to complete your antibiotics. We will place a referral to the ENT doctor. Allow 2 weeks before calling and checking on your referral.  ? ?

## 2022-01-05 NOTE — Progress Notes (Signed)
? ?  SUBJECTIVE:  ? ?CHIEF COMPLAINT / HPI:  ? ?Chief Complaint  ?Patient presents with  ? Knee Pain  ?  Both. Right more so than the left  ? ? ? ?Juan Palmer is a 51 y.o. male here for follow up for knee, throat and ear pain. ? ?Throat pain  ?He begun taking the treatment for H pylori on Friday which was delayed after Ramadan.  He is taking doxycycline, metronidazole and omeprazole. Continues to have sore throat.  No fever, vomiting or chest pain.  ? ?Ear pain ?Grossly unchanged from prior.  Continues to have bilateral ear pain. Notes he had a URI about 2 months ago.  Has been using Flonase daily with no relief.   ? ?Bilateral Knee pain  ?Reports right worse than left.  He prays on his knees several times a day but this is not new for him. No swelling, bruising or redness. Has family history Rheumatoid arthritis. Denies ankle, hand, neck and shoulder pain. Endorses intermittent low back pain but none today.  ? ? ?PERTINENT  PMH / PSH: reviewed and updated as appropriate  ? ?OBJECTIVE:  ? ?BP (!) 94/54   Pulse 68   Wt 127 lb 2 oz (57.7 kg)   SpO2 99%   BMI 16.32 kg/m?   ? ?GEN: pleasant well appearing male, in no acute distress  ?NECK: normal ROM, no lymphadenopathy  ?HENT: moist membranes, no oral lesion, no exudates, right TM opaque, left TM small effusion, no erythema bilaterally  ?CV: regular rate  ?RESP: no increased work of breathing ?MSK: ?Left and right knee  ?-Inspection: no deformity, no discoloration ?-Palpation: no joint line tenderness, no quadriceps tendon or patellar tendon tenderness, tenderness of superior patella ?-ROM: Extension: 0 degrees; Flexion: 140 degrees. Knee range of motion limited due to pain ?-Special Tests: Varus Stress: Negative; Valgus Stress: Negative; Anterior: Negative; Posterior drawer: Negative; Thessaly: Negative; Patellar grind: Equivocal  ?-Limb neurovascularly intact, no instability noted ?-normal gait ?SKIN: warm, dry, no rash on visible skin ? ? ? ? ?ASSESSMENT/PLAN:   ? ?Ear pain ?Bilateral ear pain is ongoing.  No improvement with Flonase.  Suspect eustachian tube dysfunction  Given prolonged history of ear pain discussed referral to ENT and patient is agreeable.  ? ?H pylori  Throat pain  GERD  ?Taking metronidazole, doxycycline and PPI.  He has not yet taking bismuth as it was not picked up from his pharmacy.  Advised patient to follow-up with his pharmacist regarding Bismuth.  If not able to be picked up, switch to Pylera as patient has just started H. pylori therapy.  Will need test of cure after completion of treatment. ? ?Bilateral knee pain ?Likely exacerbation by frequent praying.  Obtain x-rays.  There may be a component of osteoarthritis. No edema to suggest overt bursitis.  May need to consider rheumatologic work-up given his family history of rheumatoid arthritis.  Doubt ligamentous injury.  Diclofenac gel as needed.  ? ? ?Lyndee Hensen, DO ?PGY-3, [facility] ?01/05/2022  ? ? ? ? ? ? ? ? ?

## 2022-01-07 ENCOUNTER — Encounter: Payer: Self-pay | Admitting: Family Medicine

## 2022-01-07 ENCOUNTER — Ambulatory Visit
Admission: RE | Admit: 2022-01-07 | Discharge: 2022-01-07 | Disposition: A | Discharge status: Home / Self Care | Payer: Medicaid Other | Source: Ambulatory Visit | Attending: Family Medicine | Admitting: Family Medicine

## 2022-01-07 DIAGNOSIS — A048 Other specified bacterial intestinal infections: Secondary | ICD-10-CM | POA: Insufficient documentation

## 2022-01-07 DIAGNOSIS — M25561 Pain in right knee: Secondary | ICD-10-CM

## 2022-01-19 ENCOUNTER — Other Ambulatory Visit: Payer: Self-pay | Admitting: Family Medicine

## 2022-01-19 ENCOUNTER — Encounter: Payer: Self-pay | Admitting: Emergency Medicine

## 2022-01-19 ENCOUNTER — Ambulatory Visit
Admission: EM | Admit: 2022-01-19 | Discharge: 2022-01-19 | Disposition: A | Discharge status: Home / Self Care | Payer: Medicaid Other | Attending: Urgent Care | Admitting: Urgent Care

## 2022-01-19 DIAGNOSIS — K645 Perianal venous thrombosis: Secondary | ICD-10-CM | POA: Diagnosis not present

## 2022-01-19 MED ORDER — HYDROCORTISONE 2.5 % EX CREA: TOPICAL_CREAM | Freq: Two times a day (BID) | CUTANEOUS | 0 refills | Status: DC

## 2022-01-19 MED ORDER — LIDOCAINE (ANORECTAL) 5 % EX GEL: CUTANEOUS | 0 refills | Status: DC

## 2022-01-19 MED ORDER — DOCUSATE SODIUM 100 MG PO CAPS
100.0000 mg | ORAL_CAPSULE | Freq: Two times a day (BID) | ORAL | 0 refills | Status: DC
Start: 2022-01-19 — End: 2022-10-13

## 2022-01-19 NOTE — ED Provider Notes (Signed)
?[facility] ? ? ?MRN: [id number] DOB: September 19, 1970 ? ?Subjective:  ? ?Juan Palmer is a 51 y.o. male presenting for 3-day history of acute onset recurrent pain around the anal area with a mass.  Patient reports a remote history of the same and resolved with medications.  No procedures were ever done.  No bloody stools, heavy lifting.  No constipation. ? ?No current facility-administered medications for this encounter. ? ?Current Outpatient Medications:  ?  Bismuth Subsalicylate 262 MG TABS, Take 1 tablet (262 mg total) by mouth 4 (four) times daily for 14 days., Disp: 56 tablet, Rfl: 0 ?  cetirizine (ZYRTEC ALLERGY) 10 MG tablet, Take 1 tablet (10 mg total) by mouth at bedtime. (Patient not taking: Reported on 11/27/2021), Disp: 30 tablet, Rfl: 2 ?  diclofenac Sodium (VOLTAREN) 1 % GEL, Apply 2 g topically 4 (four) times daily., Disp: 150 g, Rfl: 1 ?  famotidine (PEPCID) 20 MG tablet, TAKE 1 TABLET BY MOUTH TWICE A DAY AS NEEDED FOR HEARTBURN OR INDIGESTION, Disp: 60 tablet, Rfl: 0 ?  fluticasone (FLONASE) 50 MCG/ACT nasal spray, SPRAY 2 SPRAYS INTO EACH NOSTRIL EVERY DAY, Disp: 16 mL, Rfl: 2 ?  omeprazole (PRILOSEC) 20 MG capsule, Take 1 capsule (20 mg total) by mouth 2 (two) times daily before a meal for 14 days., Disp: 28 capsule, Rfl: 0  ? ?No Known Allergies ? ?Past Medical History:  ?Diagnosis Date  ? Hepatitis A immune 11/26/2021  ?  ? ?History reviewed. No pertinent surgical history. ? ?Family History  ?Problem Relation Age of Onset  ? Healthy Mother   ? Healthy Father   ? ? ?Social History  ? ?Tobacco Use  ? Smoking status: Never  ? Smokeless tobacco: Never  ?Substance Use Topics  ? Alcohol use: No  ? Drug use: No  ? ? ?ROS ? ? ?Objective:  ? ?Vitals: ?BP (!) 88/57 Comment: States he usually has low BP  Pulse 67   Temp 98.5 ?F (36.9 ?C)   Resp 20   SpO2 98%  ? ?Physical Exam ?Constitutional:   ?   General: He is not in acute distress. ?   Appearance: Normal appearance. He is  well-developed and normal weight. He is not ill-appearing, toxic-appearing or diaphoretic.  ?HENT:  ?   Head: Normocephalic and atraumatic.  ?   Right Ear: External ear normal.  ?   Left Ear: External ear normal.  ?   Nose: Nose normal.  ?   Mouth/Throat:  ?   Pharynx: Oropharynx is clear.  ?Eyes:  ?   General: No scleral icterus.    ?   Right eye: No discharge.     ?   Left eye: No discharge.  ?   Extraocular Movements: Extraocular movements intact.  ?Cardiovascular:  ?   Rate and Rhythm: Normal rate.  ?Pulmonary:  ?   Effort: Pulmonary effort is normal.  ?Genitourinary: ?   Rectum: Tenderness and external hemorrhoid present. No anal fissure.  ? ? ?Musculoskeletal:  ?   Cervical back: Normal range of motion.  ?Neurological:  ?   Mental Status: He is alert and oriented to person, place, and time.  ?Psychiatric:     ?   Mood and Affect: Mood normal.     ?   Behavior: Behavior normal.     ?   Thought Content: Thought content normal.     ?   Judgment: Judgment normal.  ? ? ?Assessment and Plan :  ? ?  PDMP not reviewed this encounter. ? ?1. External hemorrhoid, thrombosed   ? ?Recommended hemorrhoidectomy but patient refuses.  Requested medication instead.  Offered him hydrocortisone cream, docusate.  Also recommend he use lidocaine gel for local pain relief.  Recommended patient return to our clinic if this does not work and I can do a hemorrhoidectomy for him. Counseled patient on potential for adverse effects with medications prescribed/recommended today, ER and return-to-clinic precautions discussed, patient verbalized understanding. ? ?  ?Wallis Bamberg, PA-C ?01/19/22 1037 ? ?

## 2022-01-19 NOTE — ED Triage Notes (Signed)
Pt here with abscess around rectum x 3 days. Not draining. Denies fever.  ?

## 2022-01-19 NOTE — Discharge Instructions (Addendum)
I do recommend that you have the hemorrhoidectomy procedure.  This is something that can help treat your buttock pain from the hemorrhoid more immediately.  It may rupture on its own and you might see some blood in the toilet.  If you change your mind about doing the procedure as you have refused it today then please come back to our clinic so that we can help you with this.  In the meantime use the hydrocortisone cream and the lidocaine gel together with the oral stool softeners. ?

## 2022-02-05 ENCOUNTER — Telehealth: Payer: Self-pay | Admitting: Family Medicine

## 2022-02-05 NOTE — Telephone Encounter (Signed)
Patient called checking on referral for ENT, he stated Dr. Susa Simmonds was going to send in a referral and if he hasn't heard anything in 2 weeks to give [place] a call back. I do not see a referral placed for ENT.   Please advise and call patient and let him know please.  Thanks!

## 2022-02-05 NOTE — Telephone Encounter (Signed)
Will forward to MD that patient saw on 01/05/22.  ENT referral was mentioned in the note but not ordered.  Nigel Ericsson,CMA

## 2022-02-05 NOTE — Addendum Note (Signed)
Addended byLyndee Hensen D on: 02/05/2022 01:02 PM   Modules accepted: Orders

## 2022-02-11 NOTE — Progress Notes (Signed)
    SUBJECTIVE:   CHIEF COMPLAINT / HPI:  Chief Complaint  Patient presents with   Follow-up    Recently treated for H. Pylori, completed treatment on 5/12. He did have improvement in his abdominal pain; however, it has started to return. Pain is sometimes worse after eating but does not notice any heartburn. He started taking OTC esomeprazole a few days ago which has helped some. Denies unintentional weight loss, rectal bleeding, nighttime defecation/pain.   He has been referred to ENT for ongoing ear pain. He has not yet received a phone call back regarding appointment.  He also has not received a phone call regarding dermatology appointment for vitiligo.  PERTINENT  PMH / PSH: chronic hepatitis B (followed by ID), H. Pylori  Patient Care Team: Zola Button, MD as PCP - General (Family Medicine)   OBJECTIVE:   BP 98/63   Pulse 65   Ht 6\' 2"  (1.88 m)   Wt 125 lb (56.7 kg)   SpO2 100%   BMI 16.05 kg/m   Physical Exam Constitutional:      General: He is not in acute distress. HENT:     Head: Normocephalic and atraumatic.  Cardiovascular:     Rate and Rhythm: Normal rate and regular rhythm.  Pulmonary:     Effort: Pulmonary effort is normal. No respiratory distress.     Breath sounds: Normal breath sounds.  Abdominal:     Palpations: Abdomen is soft.     Tenderness: There is no abdominal tenderness.  Skin:    General: Skin is warm and dry.  Neurological:     Mental Status: He is alert.        02/17/2022    2:23 PM  Depression screen PHQ 2/9  Decreased Interest 0  Down, Depressed, Hopeless 0  PHQ - 2 Score 0  Altered sleeping 0  Tired, decreased energy 1  Change in appetite 1  Feeling bad or failure about yourself  0  Trouble concentrating 0  Moving slowly or fidgety/restless 0  Suicidal thoughts 0  PHQ-9 Score 2     {Show previous vital signs (optional):23777}    ASSESSMENT/PLAN:   Abdominal pain Has started to return after H. Pylori treatment.  Consider H. Pylori recurrence, GERD, IBS, pain related to chronic HBV. Abdominal exam benign, no red flags. - f/u H. Pylori test as below - consider restarting PPI pending H. Pylori test - trial probiotic and psyllium husk  Bacterial infection due to H. pylori Completed treatment 5/12; however, has been taking OTC PPI. Advised to stop taking and will return in 2 weeks for H. Pylori breath test to ensure eradication.  Vitiligo Dermatology referral information provided today.   Chronic ear pain Will check in with referral coordinator regarding ENT referral.  Return in about 4 months (around 06/19/2022) for physical.   Zola Button, [place]

## 2022-02-11 NOTE — Patient Instructions (Addendum)
It was nice seeing you today!  Stop the Nexium for now until H. Pylori test comes back.  You can try probiotics or Metamucil (psyllium husk) for abdominal discomfort.  [facility]: Address: [address] [place], [place] [address] Phone: [phone]  Follow-up in 2 months if you are still having significant abdominal discomfort. Otherwise return in 4 months for your physical.  Stay well, Littie Deeds, MD [facility] [phone]  --  Make sure to check out at the front desk before you leave today.  Please arrive at least 15 minutes prior to your scheduled appointments.  If you had blood work today, I will send you a MyChart message or a letter if results are normal. Otherwise, I will give you a call.  If you had a referral placed, they will call you to set up an appointment. Please give [place] a call if you don't hear back in the next 2 weeks.  If you need additional refills before your next appointment, please call your pharmacy first.

## 2022-02-17 ENCOUNTER — Ambulatory Visit (INDEPENDENT_AMBULATORY_CARE_PROVIDER_SITE_OTHER): Payer: Medicaid Other | Admitting: Family Medicine

## 2022-02-17 ENCOUNTER — Encounter: Payer: Self-pay | Admitting: Family Medicine

## 2022-02-17 ENCOUNTER — Encounter: Payer: Self-pay | Admitting: *Deleted

## 2022-02-17 VITALS — BP 98/63 | HR 65 | Ht 74.0 in | Wt 125.0 lb

## 2022-02-17 DIAGNOSIS — H9203 Otalgia, bilateral: Secondary | ICD-10-CM

## 2022-02-17 DIAGNOSIS — R1084 Generalized abdominal pain: Secondary | ICD-10-CM | POA: Diagnosis not present

## 2022-02-17 DIAGNOSIS — L80 Vitiligo: Secondary | ICD-10-CM | POA: Diagnosis not present

## 2022-02-17 DIAGNOSIS — A048 Other specified bacterial intestinal infections: Secondary | ICD-10-CM

## 2022-02-17 NOTE — Assessment & Plan Note (Signed)
Completed treatment 5/12; however, has been taking OTC PPI. Advised to stop taking and will return in 2 weeks for H. Pylori breath test to ensure eradication.

## 2022-02-17 NOTE — Assessment & Plan Note (Signed)
Dermatology referral information provided today.

## 2022-02-20 ENCOUNTER — Encounter: Payer: Self-pay | Admitting: Emergency Medicine

## 2022-02-20 ENCOUNTER — Ambulatory Visit
Admission: EM | Admit: 2022-02-20 | Discharge: 2022-02-20 | Disposition: A | Discharge status: Home / Self Care | Payer: Medicaid Other | Attending: Emergency Medicine | Admitting: Emergency Medicine

## 2022-02-20 DIAGNOSIS — J309 Allergic rhinitis, unspecified: Secondary | ICD-10-CM

## 2022-02-20 DIAGNOSIS — H9203 Otalgia, bilateral: Secondary | ICD-10-CM

## 2022-02-20 MED ORDER — FLUTICASONE PROPIONATE 50 MCG/ACT NA SUSP: NASAL | 2 refills | Status: DC

## 2022-02-20 MED ORDER — FLUOCINOLONE ACETONIDE 0.01 % OT OIL: TOPICAL_OIL | OTIC | 0 refills | Status: DC

## 2022-02-20 MED ORDER — CETIRIZINE HCL 10 MG PO TABS: 10.0000 mg | ORAL_TABLET | Freq: Every day | ORAL | 2 refills | Status: DC

## 2022-02-20 NOTE — ED Provider Notes (Addendum)
[facility]    CSN: [id number] Arrival date & time: 02/20/22  1035    HISTORY   Chief Complaint  Patient presents with   Ear Irritation   HPI Juan Palmer is a 51 y.o. male. Patient presents to urgent care today complaining of itchiness in both of his ears that became worse yesterday.  Patient has previously been seen by his primary care provider for this issue, is still awaiting referral to ENT since April of this year.  Patient states he is also been experiencing scratchy throat as well which was worse 2 days ago but is since improved today.  Patient denies fever, aches, chills, nausea, vomiting, diarrhea, dizziness, shortness of breath, known sick contacts.  Patient was seen here at urgent care by me in February of this year, at that time he was diagnosed with rhinosinusitis and advised to use Flonase and cetirizine, patient states he is not taking either 1 at this time.  Per EMR, at last visit with primary care provider he was advised to continue using Flonase for presumed eustachian tube dysfunction, unclear why he is not doing so.  The history is provided by the patient.   Past Medical History:  Diagnosis Date   Hepatitis A immune 11/26/2021   Patient Active Problem List   Diagnosis Date Noted   Bacterial infection due to H. 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 No pertinent surgical history.  Home Medications    Prior to Admission medications   Medication Sig Start Date End Date Taking? Authorizing Provider  Fluocinolone Acetonide (DERMOTIC) 0.01 % OIL Placed 3 to 5 drops in each ear daily as needed for itching. 02/20/22  Yes Theadora Rama Scales, PA-C  Bismuth Subsalicylate 262 MG TABS Take 1 tablet (262 mg total) by mouth 4 (four) times daily for  14 days. 12/24/21 01/07/22  Littie Deeds, MD  cetirizine (ZYRTEC ALLERGY) 10 MG tablet Take 1 tablet (10 mg total) by mouth at bedtime. 02/20/22 05/21/22  Theadora Rama Scales, PA-C  diclofenac Sodium (VOLTAREN) 1 % GEL Apply 2 g topically 4 (four) times daily. 01/05/22   Katha Cabal, DO  docusate sodium (COLACE) 100 MG capsule Take 1 capsule (100 mg total) by mouth every 12 (twelve) hours. 01/19/22   Wallis Bamberg, PA-C  famotidine (PEPCID) 20 MG tablet TAKE 1 TABLET BY MOUTH TWICE A DAY AS NEEDED FOR HEARTBURN OR INDIGESTION 12/17/21   Littie Deeds, MD  fluticasone [facility]) 50 MCG/ACT nasal spray SPRAY 1 SPRAY INTO EACH NOSTRIL EVERY DAY 02/20/22   Theadora Rama Scales, PA-C  hydrocortisone 2.5 % cream Apply topically 2 (two) times daily. 01/19/22   Wallis Bamberg, PA-C  Lidocaine, Anorectal, 5 % GEL Apply a pea-sized amount to the affected area 3 times daily. 01/19/22   Wallis Bamberg, PA-C  omeprazole (PRILOSEC) 20 MG capsule Take 1 capsule (20 mg total) by mouth 2 (two) times daily before a meal for 14 days. 12/23/21 01/06/22  Littie Deeds, MD   Family History Family History  Problem Relation Age of Onset   Healthy Mother    Healthy Father    Social History Social History   Tobacco Use   Smoking status: Never   Smokeless tobacco: Never  Substance Use Topics   Alcohol use: No   Drug use: No  Allergies   Patient has no known allergies.  Review of Systems Review of Systems Pertinent findings noted in history of present illness.   Physical Exam Triage Vital Signs ED Triage Vitals  Enc Vitals Group     BP 07/11/21 0827 (!) 147/82     Pulse Rate 07/11/21 0827 72     Resp 07/11/21 0827 18     Temp 07/11/21 0827 98.3 F (36.8 C)     Temp Source 07/11/21 0827 Oral     SpO2 07/11/21 0827 98 %     Weight --      Height --      Head Circumference --      Peak Flow --      Pain Score 07/11/21 0826 5     Pain Loc --      Pain Edu? --      Excl. in GC? --   No data found.  Updated Vital  Signs BP 96/61   Pulse (!) 56   Temp 97.8 F (36.6 C)   Resp 20   SpO2 98%   Physical Exam Vitals and nursing note reviewed.  Constitutional:      General: He is not in acute distress.    Appearance: Normal appearance. He is not ill-appearing.  HENT:     Head: Normocephalic and atraumatic.     Salivary Glands: Right salivary gland is not diffusely enlarged or tender. Left salivary gland is not diffusely enlarged or tender.     Right Ear: Ear canal and external ear normal. No drainage. No middle ear effusion. There is no impacted cerumen. Tympanic membrane is bulging. Tympanic membrane is not injected or erythematous.     Left Ear: Ear canal and external ear normal. No drainage.  No middle ear effusion. There is no impacted cerumen. Tympanic membrane is bulging. Tympanic membrane is not injected or erythematous.     Ears:     Comments: Bilateral EACs normal, both TMs bulging with clear fluid    Nose: Rhinorrhea present. No nasal deformity, septal deviation, signs of injury, nasal tenderness, mucosal edema or congestion. Rhinorrhea is clear.     Right Nostril: Occlusion present. No foreign body, epistaxis or septal hematoma.     Left Nostril: Occlusion present. No foreign body, epistaxis or septal hematoma.     Right Turbinates: Enlarged, swollen and pale.     Left Turbinates: Enlarged, swollen and pale.     Right Sinus: No maxillary sinus tenderness or frontal sinus tenderness.     Left Sinus: No maxillary sinus tenderness or frontal sinus tenderness.     Mouth/Throat:     Lips: Pink. No lesions.     Mouth: Mucous membranes are moist. No oral lesions.     Pharynx: Oropharynx is clear. Uvula midline. No posterior oropharyngeal erythema or uvula swelling.     Tonsils: No tonsillar exudate. 0 on the right. 0 on the left.     Comments: Postnasal drip Eyes:     General: Lids are normal.        Right eye: No discharge.        Left eye: No discharge.     Extraocular Movements:  Extraocular movements intact.     Conjunctiva/sclera: Conjunctivae normal.     Right eye: Right conjunctiva is not injected.     Left eye: Left conjunctiva is not injected.  Neck:     Trachea: Trachea and phonation normal.  Cardiovascular:     Rate and Rhythm: Normal rate  and regular rhythm.     Pulses: Normal pulses.     Heart sounds: Normal heart sounds. No murmur heard.    No friction rub. No gallop.  Pulmonary:     Effort: Pulmonary effort is normal. No accessory muscle usage, prolonged expiration or respiratory distress.     Breath sounds: Normal breath sounds. No stridor, decreased air movement or transmitted upper airway sounds. No decreased breath sounds, wheezing, rhonchi or rales.  Chest:     Chest wall: No tenderness.  Musculoskeletal:        General: Normal range of motion.     Cervical back: Normal range of motion and neck supple. Normal range of motion.  Lymphadenopathy:     Cervical: No cervical adenopathy.  Skin:    General: Skin is warm and dry.     Findings: No erythema or rash.  Neurological:     General: No focal deficit present.     Mental Status: He is alert and oriented to person, place, and time.  Psychiatric:        Mood and Affect: Mood normal.        Behavior: Behavior normal.     Visual Acuity Right Eye Distance:   Left Eye Distance:   Bilateral Distance:    Right Eye Near:   Left Eye Near:    Bilateral Near:     UC Couse / Diagnostics / Procedures:    EKG  Radiology No results found.  Procedures Procedures (including critical care time)  UC Diagnoses / Final Clinical Impressions(s)   I have reviewed the triage vital signs and the nursing notes.  Pertinent labs & imaging results that were available during my care of the patient were reviewed by me and considered in my medical decision making (see chart for details).   Final diagnoses:  Otalgia of both ears  Allergic rhinitis, unspecified seasonality, unspecified trigger   Patient  advised, again, to use cetirizine and Flonase on a regular basis.  Patient provided with a prescription for DermOtic oil for itchiness in his ears.  Return precautions advised.  ED Prescriptions     Medication Sig Dispense Auth. Provider   cetirizine (ZYRTEC ALLERGY) 10 MG tablet Take 1 tablet (10 mg total) by mouth at bedtime. 30 tablet Theadora Rama Scales, PA-C   fluticasone (FLONASE) 50 MCG/ACT nasal spray SPRAY 1 SPRAY INTO EACH NOSTRIL EVERY DAY 16 mL Theadora Rama Scales, PA-C   Fluocinolone Acetonide (DERMOTIC) 0.01 % OIL Placed 3 to 5 drops in each ear daily as needed for itching. 20 mL Theadora Rama Scales, PA-C      PDMP not reviewed this encounter.  Pending results:  Labs Reviewed - No data to display  Medications Ordered in UC: Medications - No data to display  Disposition Upon Discharge:  Condition: stable for discharge home Home: take medications as prescribed; routine discharge instructions as discussed; follow up as advised.  Patient presented with an acute illness with associated systemic symptoms and significant discomfort requiring urgent management. In my opinion, this is a condition that a prudent lay person (someone who possesses an average knowledge of health and medicine) may potentially expect to result in complications if not addressed urgently such as respiratory distress, impairment of bodily function or dysfunction of bodily organs.   Routine symptom specific, illness specific and/or disease specific instructions were discussed with the patient and/or caregiver at length.   As such, the patient has been evaluated and assessed, work-up was performed and treatment was provided  in alignment with urgent care protocols and evidence based medicine.  Patient/parent/caregiver has been advised that the patient may require follow up for further testing and treatment if the symptoms continue in spite of treatment, as clinically indicated and appropriate.  If the  patient was tested for COVID-19, Influenza and/or RSV, then the patient/parent/guardian was advised to isolate at home pending the results of his/her diagnostic coronavirus test and potentially longer if they're positive. I have also advised pt that if his/her COVID-19 test returns positive, it's recommended to self-isolate for at least 10 days after symptoms first appeared AND until fever-free for 24 hours without fever reducer AND other symptoms have improved or resolved. Discussed self-isolation recommendations as well as instructions for household member/close contacts as per the [facility] and [place] DHHS, and also gave patient the COVID packet with this information.  Patient/parent/caregiver has been advised to return to the [facility] or PCP in 3-5 days if no better; to PCP or the Emergency Department if new signs and symptoms develop, or if the current signs or symptoms continue to change or worsen for further workup, evaluation and treatment as clinically indicated and appropriate  The patient will follow up with their current PCP if and as advised. If the patient does not currently have a PCP we will assist them in obtaining one.   The patient may need specialty follow up if the symptoms continue, in spite of conservative treatment and management, for further workup, evaluation, consultation and treatment as clinically indicated and appropriate.  Patient/parent/caregiver verbalized understanding and agreement of plan as discussed.  All questions were addressed during visit.  Please see discharge instructions below for further details of plan.  Discharge Instructions:   Discharge Instructions      Your symptoms and my physical exam findings are concerning for exacerbation of your underlying allergies.     Please see the list below for recommended medications, dosages and frequencies to provide relief of current symptoms:     Zyrtec (cetirizine): This is an excellent second-generation antihistamine that  helps to reduce respiratory inflammatory response to environmental allergens.  In some patients, this medication can cause daytime sleepiness so I recommend that you take 1 tablet daily at bedtime.     Flonase (fluticasone): This is a steroid nasal spray that you use once daily, 1 spray in each nare.  This medication does not work well if you decide to use it only used as you feel you need to, it works best used on a daily basis.  After 3 to 5 days of use, you will notice significant reduction of the inflammation and mucus production that is currently being caused by exposure to allergens, whether seasonal or environmental.  The most common side effect of this medication is nosebleeds.  If you experience a nosebleed, please discontinue use for 1 week, then feel free to resume.  I have provided you with a prescription.     Derm otic (fluocinolone): This is a steroid eardrop that you can instill in each ear once daily as needed.  Once your symptoms have resolved, consider using this oil 1-2 times a week to keep your symptoms well controlled.  If you find that you have not had improvement of your symptoms in the next 5 to 7 days, please follow-up with your primary care provider or return here to urgent care for repeat evaluation and further recommendations.   Thank you for visiting urgent care today.  We appreciate the opportunity to participate in your care.  This office note has been dictated using Teaching laboratory technicianDragon speech recognition software.  Unfortunately, and despite my best efforts, this method of dictation can sometimes lead to occasional typographical or grammatical errors.  I apologize in advance if this occurs.     Theadora RamaMorgan, Sara Keys Scales, PA-C 02/20/22 1248    Theadora RamaMorgan, Juan Hansson Scales, PA-C 02/20/22 1248

## 2022-02-20 NOTE — Discharge Instructions (Signed)
Your symptoms and my physical exam findings are concerning for exacerbation of your underlying allergies.     Please see the list below for recommended medications, dosages and frequencies to provide relief of current symptoms:     Zyrtec (cetirizine): This is an excellent second-generation antihistamine that helps to reduce respiratory inflammatory response to environmental allergens.  In some patients, this medication can cause daytime sleepiness so I recommend that you take 1 tablet daily at bedtime.     Flonase (fluticasone): This is a steroid nasal spray that you use once daily, 1 spray in each nare.  This medication does not work well if you decide to use it only used as you feel you need to, it works best used on a daily basis.  After 3 to 5 days of use, you will notice significant reduction of the inflammation and mucus production that is currently being caused by exposure to allergens, whether seasonal or environmental.  The most common side effect of this medication is nosebleeds.  If you experience a nosebleed, please discontinue use for 1 week, then feel free to resume.  I have provided you with a prescription.     Derm otic (fluocinolone): This is a steroid eardrop that you can instill in each ear once daily as needed.  Once your symptoms have resolved, consider using this oil 1-2 times a week to keep your symptoms well controlled.  If you find that you have not had improvement of your symptoms in the next 5 to 7 days, please follow-up with your primary care provider or return here to urgent care for repeat evaluation and further recommendations.   Thank you for visiting urgent care today.  We appreciate the opportunity to participate in your care.

## 2022-02-20 NOTE — ED Triage Notes (Signed)
Pt here with bilateral ear irritation and itchiness since yesterday.

## 2022-03-03 ENCOUNTER — Other Ambulatory Visit: Payer: Medicaid Other

## 2022-03-03 ENCOUNTER — Other Ambulatory Visit: Payer: Self-pay

## 2022-03-03 DIAGNOSIS — A048 Other specified bacterial intestinal infections: Secondary | ICD-10-CM

## 2022-03-05 LAB — H. PYLORI BREATH TEST: H pylori Breath Test: NEGATIVE

## 2022-05-22 ENCOUNTER — Ambulatory Visit (HOSPITAL_COMMUNITY): Admission: RE | Admit: 2022-05-22 | Payer: Medicaid Other | Source: Ambulatory Visit

## 2022-06-02 ENCOUNTER — Encounter: Payer: Self-pay | Admitting: Family Medicine

## 2022-06-02 ENCOUNTER — Ambulatory Visit (INDEPENDENT_AMBULATORY_CARE_PROVIDER_SITE_OTHER): Payer: Medicaid Other | Admitting: Family Medicine

## 2022-06-02 VITALS — BP 95/62 | HR 61 | Ht 74.0 in | Wt 127.0 lb

## 2022-06-02 DIAGNOSIS — R42 Dizziness and giddiness: Secondary | ICD-10-CM

## 2022-06-02 DIAGNOSIS — E782 Mixed hyperlipidemia: Secondary | ICD-10-CM

## 2022-06-02 DIAGNOSIS — R079 Chest pain, unspecified: Secondary | ICD-10-CM

## 2022-06-02 DIAGNOSIS — Z8619 Personal history of other infectious and parasitic diseases: Secondary | ICD-10-CM

## 2022-06-02 MED ORDER — BLOOD PRESSURE KIT: 1.0000 "application " | PACK | 0 refills | Status: AC | PRN

## 2022-06-02 NOTE — Patient Instructions (Addendum)
It was nice seeing you today!  I am sending you a blood pressure cuff. Check your blood pressure from time to time, especially if you are feeling dizzy or lightheaded.  Blood work today.  Read about the shingles vaccine.  Stay well, Zola Button, MD [place] [phone]  --  Make sure to check out at the front desk before you leave today.  Please arrive at least 15 minutes prior to your scheduled appointments.  If you had blood work today, I will send you a MyChart message or a letter if results are normal. Otherwise, I will give you a call.  If you had a referral placed, they will call you to set up an appointment. Please give [place] a call if you don't hear back in the next 2 weeks.  If you need additional refills before your next appointment, please call your pharmacy first.

## 2022-06-02 NOTE — Progress Notes (Signed)
    SUBJECTIVE:   CHIEF COMPLAINT / HPI:  Chief Complaint  Patient presents with   Fatigue   Dizziness   Chest Pain    Not everyday, happens at random times    Patient reports that he had an episode of chest pain that lasted about 7 to 10 minutes about a week and a half ago while at rest.  Pain resolved on its own.  He has also had some intermittent dizziness when standing up and feeling generally weak however this has improved over the past 2 days.  Currently he is at his baseline.  Reports he has been eating and drinking normally.  Denies numbness, tingling, focal weakness.  PERTINENT  PMH / PSH: GERD, H. pylori infection  Patient Care Team: Zola Button, MD as PCP - General (Family Medicine)   OBJECTIVE:   BP 95/62   Pulse 61   Ht 6\' 2"  (1.88 m)   Wt 127 lb (57.6 kg)   SpO2 100%   BMI 16.31 kg/m   Physical Exam Constitutional:      General: He is not in acute distress. HENT:     Head: Normocephalic and atraumatic.  Eyes:     General: No scleral icterus.    Extraocular Movements: Extraocular movements intact.     Pupils: Pupils are equal, round, and reactive to light.  Cardiovascular:     Rate and Rhythm: Normal rate and regular rhythm.  Pulmonary:     Effort: Pulmonary effort is normal. No respiratory distress.     Breath sounds: Normal breath sounds.  Neurological:     Mental Status: He is alert.     Cranial Nerves: No cranial nerve deficit.     Motor: No weakness.         06/02/2022    2:47 PM  Depression screen PHQ 2/9  Decreased Interest 0  Down, Depressed, Hopeless 0  PHQ - 2 Score 0  Altered sleeping 1  Tired, decreased energy 1  Change in appetite 1  Feeling bad or failure about yourself  0  Trouble concentrating 0  Moving slowly or fidgety/restless 0  Suicidal thoughts 0  PHQ-9 Score 3  Difficult doing work/chores Somewhat difficult    Orthostatic VS for the past 24 hrs:  BP- Lying Pulse- Lying BP- Sitting Pulse- Sitting BP- Standing at 0  minutes Pulse- Standing at 0 minutes  06/02/22 1454 95/56 58 106/60 68 115/62 70     {Show previous vital signs (optional):23777}    ASSESSMENT/PLAN:   Chest discomfort Single episode of chest pain that occurred at rest spontaneously resolved, none currently.  Advised to call or go to the ED if recurrence of chest pain.  Dizziness Likely due to orthostasis based on symptoms though orthostatics negative here.  At baseline he has borderline low normal BP which I think could be contributing to his symptoms.  Neurologically intact. - hydration - labs: CBC, BMP - BP cuff to monitor at home  HLD - lipid panel  Return in about 1 year (around 06/03/2023) for physical.   Zola Button, [place]

## 2022-06-03 LAB — CBC WITH DIFFERENTIAL/PLATELET
Basophils Absolute: 0 10*3/uL (ref 0.0–0.2)
Basos: 1 %
EOS (ABSOLUTE): 0.1 10*3/uL (ref 0.0–0.4)
Eos: 2 %
Hematocrit: 41.4 % (ref 37.5–51.0)
Hemoglobin: 14.1 g/dL (ref 13.0–17.7)
Immature Grans (Abs): 0 10*3/uL (ref 0.0–0.1)
Immature Granulocytes: 0 %
Lymphocytes Absolute: 1.7 10*3/uL (ref 0.7–3.1)
Lymphs: 43 %
MCH: 30.4 pg (ref 26.6–33.0)
MCHC: 34.1 g/dL (ref 31.5–35.7)
MCV: 89 fL (ref 79–97)
Monocytes Absolute: 0.4 10*3/uL (ref 0.1–0.9)
Monocytes: 9 %
Neutrophils Absolute: 1.8 10*3/uL (ref 1.4–7.0)
Neutrophils: 45 %
Platelets: 208 10*3/uL (ref 150–450)
RBC: 4.64 x10E6/uL (ref 4.14–5.80)
RDW: 13.1 % (ref 11.6–15.4)
WBC: 4 10*3/uL (ref 3.4–10.8)

## 2022-06-03 LAB — BASIC METABOLIC PANEL
BUN/Creatinine Ratio: 19 (ref 9–20)
BUN: 17 mg/dL (ref 6–24)
CO2: 23 mmol/L (ref 20–29)
Calcium: 8.9 mg/dL (ref 8.7–10.2)
Chloride: 100 mmol/L (ref 96–106)
Creatinine, Ser: 0.89 mg/dL (ref 0.76–1.27)
Glucose: 75 mg/dL (ref 70–99)
Potassium: 4.7 mmol/L (ref 3.5–5.2)
Sodium: 141 mmol/L (ref 134–144)
eGFR: 104 mL/min/{1.73_m2} (ref >59)

## 2022-06-03 LAB — LIPID PANEL
Chol/HDL Ratio: 3 ratio (ref 0.0–5.0)
Cholesterol, Total: 160 mg/dL (ref 100–199)
HDL: 53 mg/dL (ref >39)
LDL Chol Calc (NIH): 71 mg/dL (ref 0–99)
Triglycerides: 219 mg/dL — ABNORMAL HIGH (ref 0–149)
VLDL Cholesterol Cal: 36 mg/dL (ref 5–40)

## 2022-08-25 ENCOUNTER — Ambulatory Visit (INDEPENDENT_AMBULATORY_CARE_PROVIDER_SITE_OTHER): Payer: Medicaid Other | Admitting: Family Medicine

## 2022-08-25 ENCOUNTER — Encounter: Payer: Self-pay | Admitting: Family Medicine

## 2022-08-25 VITALS — BP 95/65 | HR 58 | Temp 98.3°F | Ht 74.0 in | Wt 127.8 lb

## 2022-08-25 DIAGNOSIS — R1084 Generalized abdominal pain: Secondary | ICD-10-CM | POA: Diagnosis present

## 2022-08-25 NOTE — Patient Instructions (Addendum)
It was nice seeing you today!  Blood work today.  Stay well, Vivaan Helseth, MD [facility] [phone]  --  Make sure to check out at the front desk before you leave today.  Please arrive at least 15 minutes prior to your scheduled appointments.  If you had blood work today, I will send you a MyChart message or a letter if results are normal. Otherwise, I will give you a call.  If you had a referral placed, they will call you to set up an appointment. Please give us a call if you don't hear back in the next 2 weeks.  If you need additional refills before your next appointment, please call your pharmacy first.  

## 2022-08-25 NOTE — Progress Notes (Signed)
    SUBJECTIVE:   CHIEF COMPLAINT / HPI:  Chief Complaint  Patient presents with   Abdominal Pain    X 2 weeks    Patient reports he has developed intermittent abdominal pain about 3 to 4 weeks ago while he was visiting family in [place].  Pain is described as diffuse, cramping.  It does improve with passing gas and bowel movements.  No relation to eating.  He denies nausea, vomiting, diarrhea.  He feels the pain is similar to when he had H. pylori infection in the past.  He was taking omeprazole but he stopped the medication 2 weeks ago so he could be tested for this today.  He had gone to [place] to visit his mother who was ill but unfortunately she passed away before he was able to get to there.  PERTINENT  PMH / PSH: GERD, H. pylori infection  Patient Care Team: Littie Deeds, MD as PCP - General (Family Medicine)   OBJECTIVE:   BP 95/65   Pulse (!) 58   Temp 98.3 F (36.8 C)   Ht 6\' 2"  (1.88 m)   Wt 127 lb 12.8 oz (58 kg)   SpO2 98%   BMI 16.41 kg/m   Physical Exam Constitutional:      General: He is not in acute distress. Cardiovascular:     Rate and Rhythm: Normal rate and regular rhythm.  Pulmonary:     Effort: Pulmonary effort is normal. No respiratory distress.     Breath sounds: Normal breath sounds.  Abdominal:     General: Bowel sounds are normal.     Palpations: Abdomen is soft.     Tenderness: There is no abdominal tenderness.  Neurological:     Mental Status: He is alert.         08/25/2022   11:20 AM  Depression screen PHQ 2/9  Decreased Interest 0  Down, Depressed, Hopeless 0  PHQ - 2 Score 0  Altered sleeping 0  Tired, decreased energy 0  Change in appetite 0  Feeling bad or failure about yourself  0  Trouble concentrating 0  Moving slowly or fidgety/restless 0  Suicidal thoughts 0  PHQ-9 Score 0     {Show previous vital signs (optional):23777}    ASSESSMENT/PLAN:   Abdominal pain  Intermittent abdominal pain which is diffuse  ongoing for a few weeks.  Abdominal exam is benign, no pain currently, doubt any acute surgical process.  Could be recurrence of H. pylori infection as he has had this in the past.  Will check labs to evaluate for anemia, inflammation, renal dysfunction, electrolyte abnormalities, liver inflammation, pancreatitis.  If unremarkable, could consider IBS. - labs: CBC w diff, CMP, lipase, H. Pylori breath test  Return in about 2 months (around 10/26/2022) for f/u abdominal pain.   12/25/2022, MD [facility]

## 2022-08-26 LAB — CBC WITH DIFFERENTIAL/PLATELET
Basophils Absolute: 0 10*3/uL (ref 0.0–0.2)
Basos: 1 %
EOS (ABSOLUTE): 0.1 10*3/uL (ref 0.0–0.4)
Eos: 2 %
Hematocrit: 43.8 % (ref 37.5–51.0)
Hemoglobin: 14.3 g/dL (ref 13.0–17.7)
Immature Grans (Abs): 0 10*3/uL (ref 0.0–0.1)
Immature Granulocytes: 0 %
Lymphocytes Absolute: 1.8 10*3/uL (ref 0.7–3.1)
Lymphs: 51 %
MCH: 29.7 pg (ref 26.6–33.0)
MCHC: 32.6 g/dL (ref 31.5–35.7)
MCV: 91 fL (ref 79–97)
Monocytes Absolute: 0.4 10*3/uL (ref 0.1–0.9)
Monocytes: 11 %
Neutrophils Absolute: 1.2 10*3/uL — ABNORMAL LOW (ref 1.4–7.0)
Neutrophils: 35 %
Platelets: 217 10*3/uL (ref 150–450)
RBC: 4.82 x10E6/uL (ref 4.14–5.80)
RDW: 13.2 % (ref 11.6–15.4)
WBC: 3.5 10*3/uL (ref 3.4–10.8)

## 2022-08-26 LAB — COMPREHENSIVE METABOLIC PANEL
ALT: 21 IU/L (ref 0–44)
AST: 25 IU/L (ref 0–40)
Albumin/Globulin Ratio: 1.7 (ref 1.2–2.2)
Albumin: 4.6 g/dL (ref 4.1–5.1)
Alkaline Phosphatase: 64 IU/L (ref 44–121)
BUN/Creatinine Ratio: 11 (ref 9–20)
BUN: 14 mg/dL (ref 6–24)
Bilirubin Total: 0.6 mg/dL (ref 0.0–1.2)
CO2: 25 mmol/L (ref 20–29)
Calcium: 9.2 mg/dL (ref 8.7–10.2)
Chloride: 101 mmol/L (ref 96–106)
Creatinine, Ser: 1.3 mg/dL — ABNORMAL HIGH (ref 0.76–1.27)
Globulin, Total: 2.7 g/dL (ref 1.5–4.5)
Glucose: 79 mg/dL (ref 70–99)
Potassium: 4.6 mmol/L (ref 3.5–5.2)
Sodium: 137 mmol/L (ref 134–144)
Total Protein: 7.3 g/dL (ref 6.0–8.5)
eGFR: 67 mL/min/{1.73_m2} (ref >59)

## 2022-08-26 LAB — LIPASE: Lipase: 31 U/L (ref 13–78)

## 2022-08-27 LAB — H. PYLORI BREATH TEST: H pylori Breath Test: NEGATIVE

## 2022-09-02 IMAGING — CR DG KNEE COMPLETE 4+V*L*
4 series · 4 of 4 positions shown · non-contrast
Comparison: 01/07/2022

CLINICAL DATA: Bilateral knee pain

EXAM:
LEFT KNEE - COMPLETE 4+ VIEW

[t knee ap left]
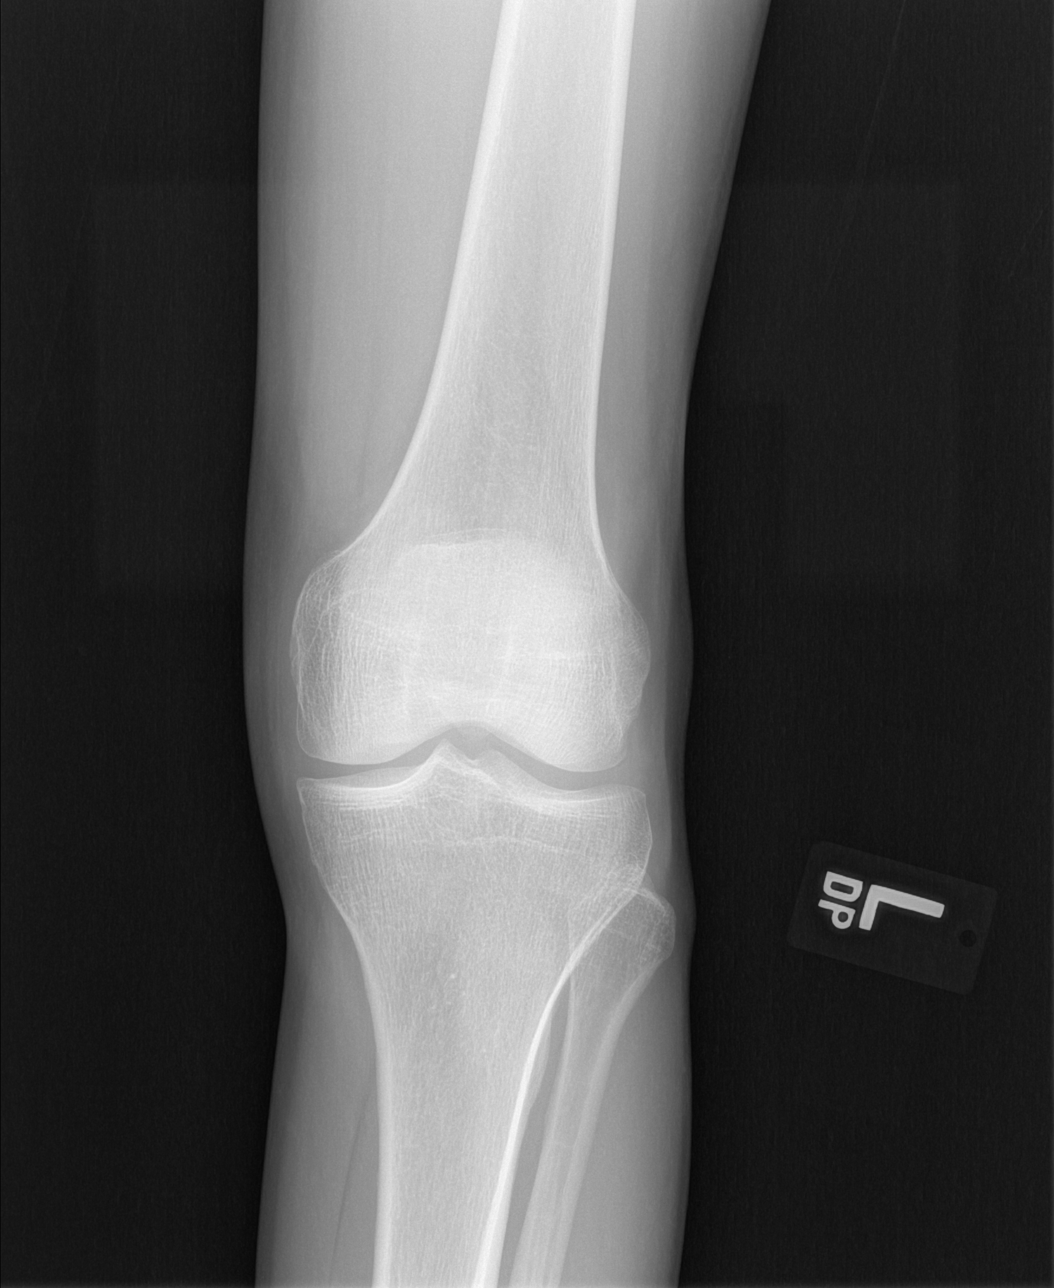

[t knee oblique left (1 of 2)]
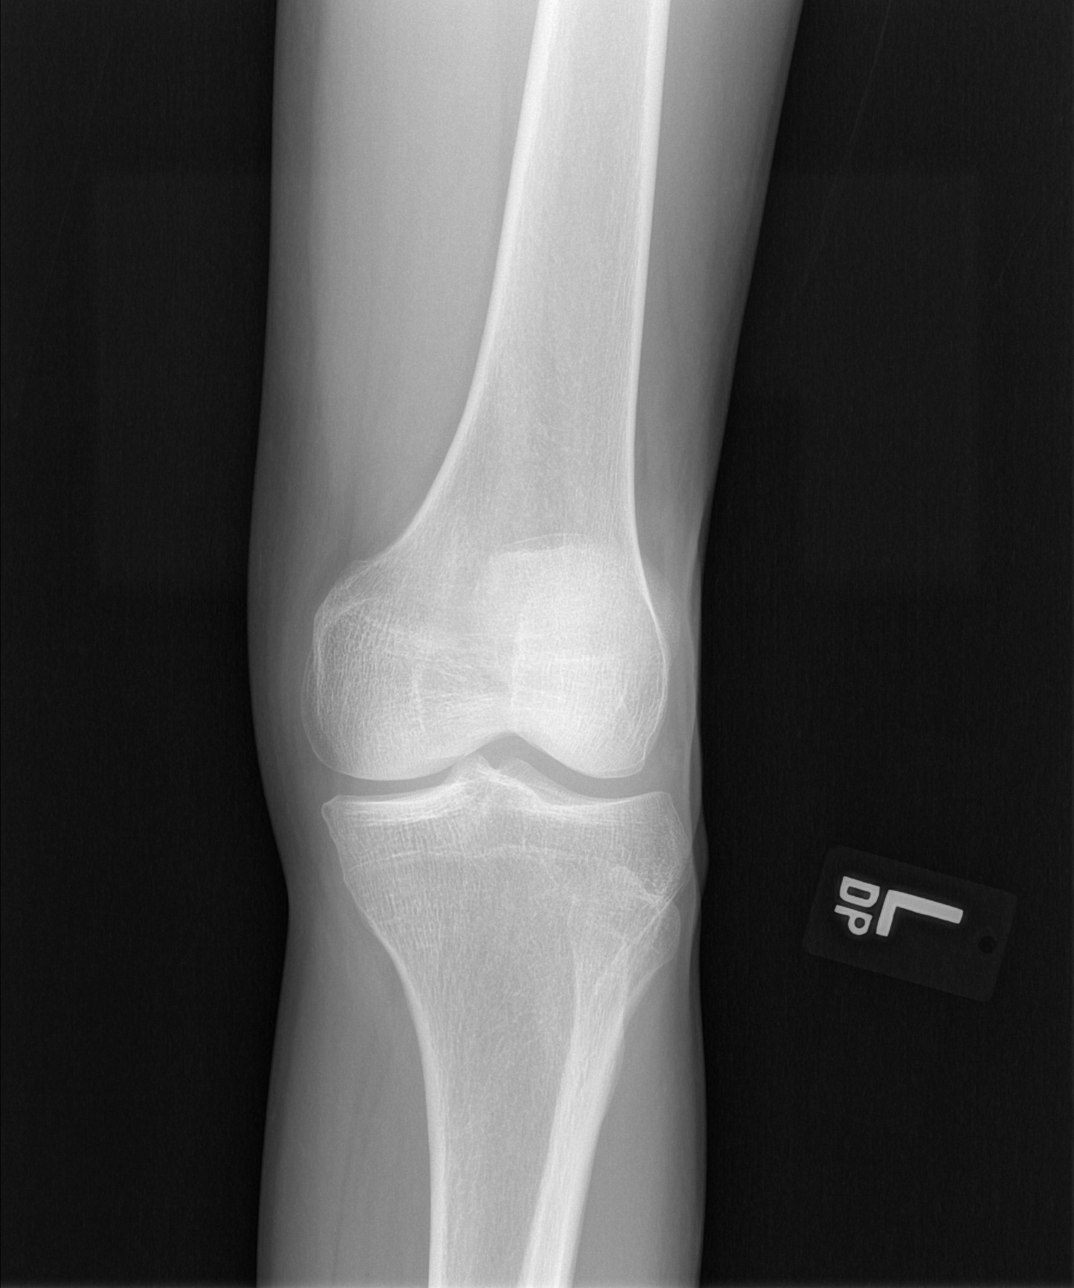

[t knee oblique left (2 of 2)]
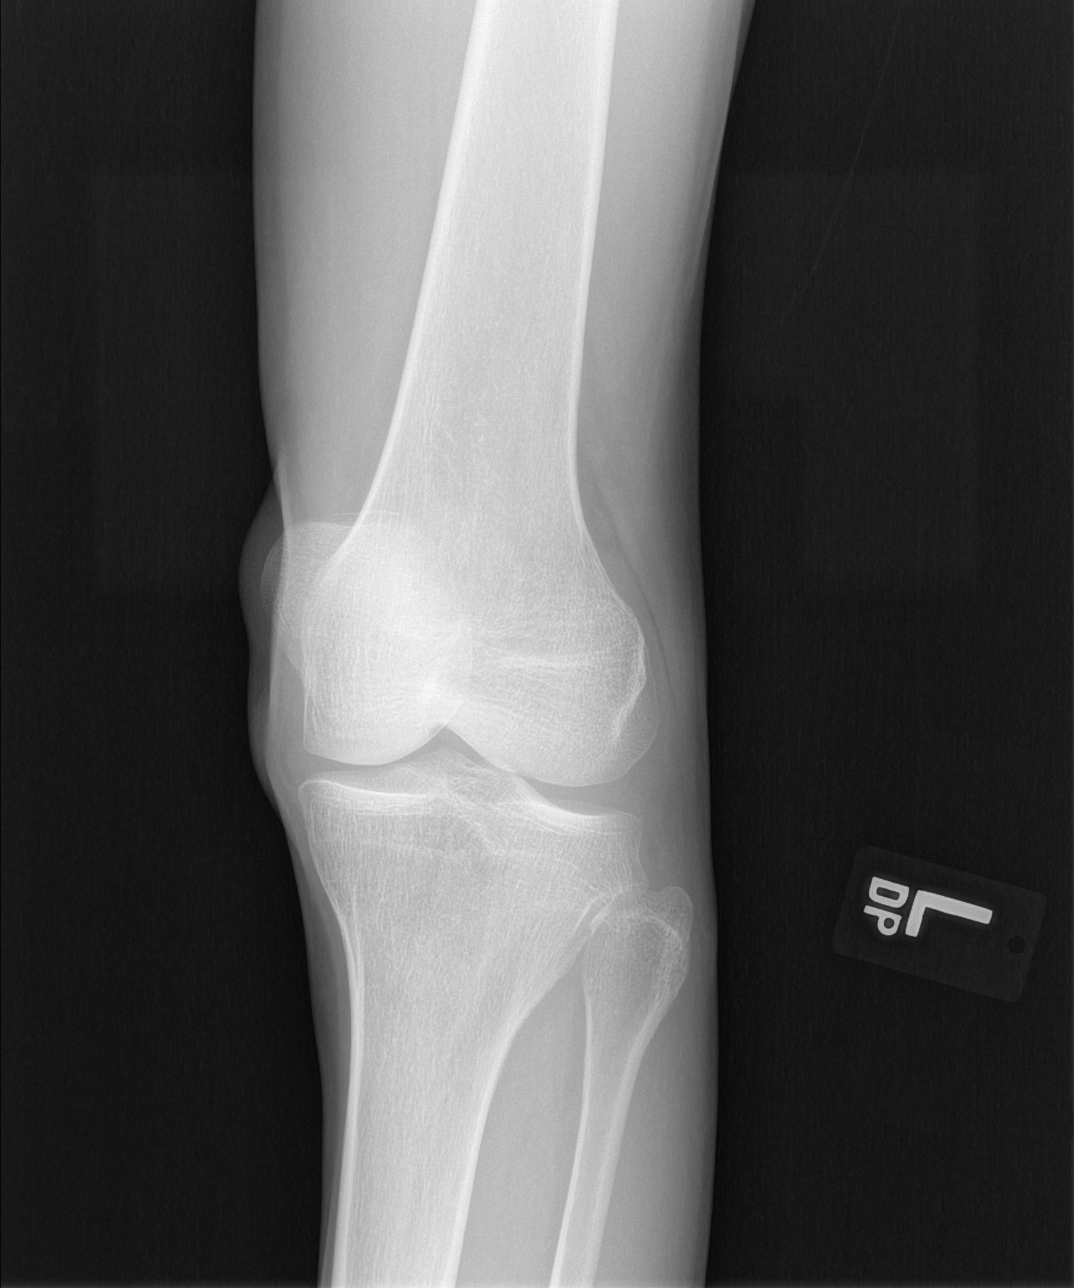

[t knee lat left]
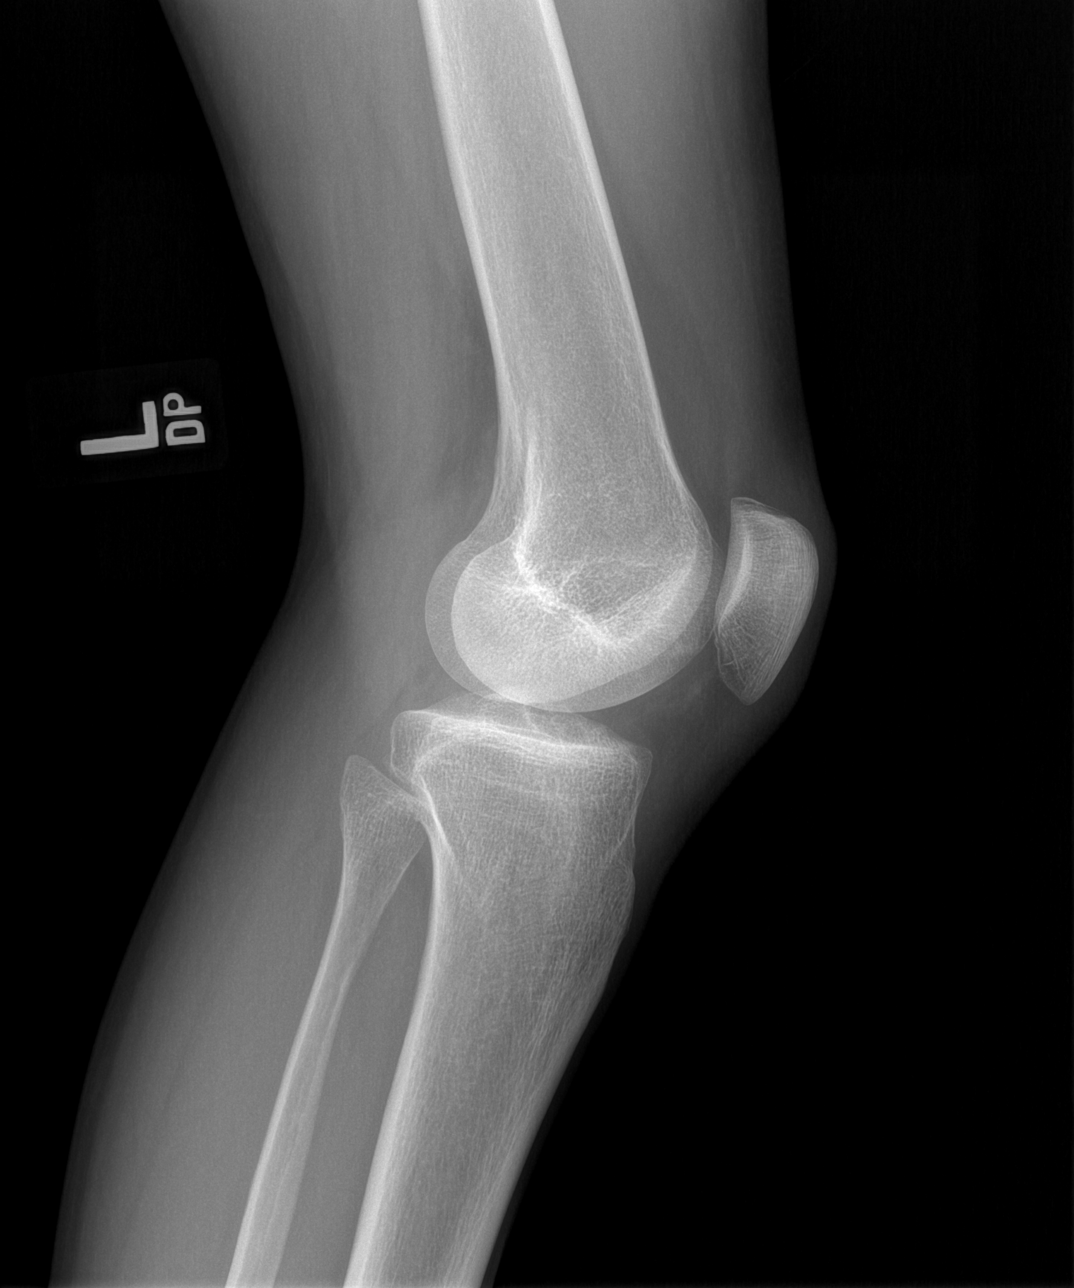

[4 of 4 positions shown; findings below may reference images not displayed]

FINDINGS: No evidence of fracture, dislocation, or joint effusion. No evidence
of arthropathy or other focal bone abnormality. Soft tissues are
unremarkable.
IMPRESSION: Negative.

## 2022-10-09 ENCOUNTER — Encounter: Payer: Self-pay | Admitting: Family Medicine

## 2022-10-09 ENCOUNTER — Ambulatory Visit (HOSPITAL_COMMUNITY)
Admission: RE | Admit: 2022-10-09 | Discharge: 2022-10-09 | Disposition: A | Discharge status: Home / Self Care | Payer: Commercial Managed Care - HMO | Source: Ambulatory Visit | Attending: Family Medicine | Admitting: Family Medicine

## 2022-10-09 ENCOUNTER — Ambulatory Visit (INDEPENDENT_AMBULATORY_CARE_PROVIDER_SITE_OTHER): Payer: Medicaid Other | Admitting: Family Medicine

## 2022-10-09 ENCOUNTER — Other Ambulatory Visit: Payer: Self-pay | Admitting: Family Medicine

## 2022-10-09 VITALS — BP 102/53 | HR 59 | Ht 74.0 in | Wt 128.0 lb

## 2022-10-09 DIAGNOSIS — R079 Chest pain, unspecified: Secondary | ICD-10-CM | POA: Insufficient documentation

## 2022-10-09 DIAGNOSIS — R7989 Other specified abnormal findings of blood chemistry: Secondary | ICD-10-CM | POA: Diagnosis not present

## 2022-10-09 DIAGNOSIS — R001 Bradycardia, unspecified: Secondary | ICD-10-CM | POA: Diagnosis not present

## 2022-10-09 MED ORDER — NITROGLYCERIN 0.3 MG SL SUBL: 0.3000 mg | SUBLINGUAL_TABLET | SUBLINGUAL | 0 refills | Status: DC | PRN

## 2022-10-09 MED ORDER — ASPIRIN 81 MG PO TBEC: 81.0000 mg | DELAYED_RELEASE_TABLET | Freq: Every day | ORAL | 12 refills | Status: DC

## 2022-10-09 NOTE — Patient Instructions (Addendum)
It was nice seeing you today!  I am referring you to cardiologist for further evaluation.  Go ahead and start taking a baby aspirin once a day.  I am prescribing you nitroglycerin.  If you have chest pain again before we are seen by the cardiologist, take 1 tablet.  If the pain does not go away after taking the nitroglycerin, you should go to the emergency room.  Stay well, Zola Button, MD [place] [phone]  --  Make sure to check out at the front desk before you leave today.  Please arrive at least 15 minutes prior to your scheduled appointments.  If you had blood work today, I will send you a MyChart message or a letter if results are normal. Otherwise, I will give you a call.  If you had a referral placed, they will call you to set up an appointment. Please give [place] a call if you don't hear back in the next 2 weeks.  If you need additional refills before your next appointment, please call your pharmacy first.

## 2022-10-09 NOTE — Progress Notes (Signed)
    SUBJECTIVE:   CHIEF COMPLAINT / HPI:  Chief Complaint  Patient presents with   Chest Pain    Patient presents today due to sudden onset of central chest pain/pressure that started this morning, lasted about 10 minutes before resolving spontaneously.  He was at work when this happened.  Denies radiation of pain to arm or jaw.  Denies shortness of breath, nausea.  Pain was not exacerbated by exertion and not specifically improved by rest.  He is not having any pain currently.  PERTINENT  PMH / PSH: GERD, H. pylori infection No family history of cardiac disease No smoking history  Patient Care Team: Zola Button, MD as PCP - General (Family Medicine)   OBJECTIVE:   BP (!) 102/53   Pulse (!) 59   Ht 6\' 2"  (1.88 m)   Wt 128 lb (58.1 kg)   SpO2 100%   BMI 16.43 kg/m   Physical Exam Constitutional:      General: He is not in acute distress. Cardiovascular:     Rate and Rhythm: Normal rate and regular rhythm.     Heart sounds: Normal heart sounds.  Pulmonary:     Effort: Pulmonary effort is normal. No respiratory distress.     Breath sounds: Normal breath sounds.  Musculoskeletal:     Right lower leg: No edema.     Left lower leg: No edema.  Neurological:     Mental Status: He is alert.         10/09/2022    2:46 PM  Depression screen PHQ 2/9  Decreased Interest 0  Down, Depressed, Hopeless 0  PHQ - 2 Score 0  Altered sleeping 1  Tired, decreased energy 1  Change in appetite 0  Feeling bad or failure about yourself  0  Trouble concentrating 0  Moving slowly or fidgety/restless 0  Suicidal thoughts 0  PHQ-9 Score 2     {Show previous vital signs (optional):23777}    ASSESSMENT/PLAN:   Chest pain Sudden onset of chest pain/pressure earlier today lasting about 10 minutes.  Does not seem to be related to exertion.  No pain currently.  Given nature of pain, do not think that underlying CAD cannot be completely ruled out. - EKG obtained, NSR without obvious  ischemic changes - urgent cardiology referral - start ASA 81 mg in the meantime - start NTG prn - strict ED precautions given  Elevated serum creatinine Serum creatinine on most recent labs mildly elevated we will repeat BMP today.  Return in about 4 weeks (around 11/06/2022) for Follow-up chest pain.   Zola Button, MD [place]

## 2022-10-10 LAB — BASIC METABOLIC PANEL
BUN/Creatinine Ratio: 11 (ref 9–20)
BUN: 10 mg/dL (ref 6–24)
CO2: 25 mmol/L (ref 20–29)
Calcium: 9.5 mg/dL (ref 8.7–10.2)
Chloride: 98 mmol/L (ref 96–106)
Creatinine, Ser: 0.91 mg/dL (ref 0.76–1.27)
Glucose: 84 mg/dL (ref 70–99)
Potassium: 4.4 mmol/L (ref 3.5–5.2)
Sodium: 134 mmol/L (ref 134–144)
eGFR: 103 mL/min/{1.73_m2} (ref >59)

## 2022-10-13 ENCOUNTER — Encounter: Payer: Self-pay | Admitting: Internal Medicine

## 2022-10-13 ENCOUNTER — Ambulatory Visit: Payer: Medicaid Other | Attending: Internal Medicine | Admitting: Internal Medicine

## 2022-10-13 VITALS — BP 110/52 | HR 59 | Ht 74.0 in | Wt 126.6 lb

## 2022-10-13 DIAGNOSIS — R072 Precordial pain: Secondary | ICD-10-CM

## 2022-10-13 NOTE — Progress Notes (Signed)
Cardiology Office Note:    Date:  10/13/2022   ID:  Benson Setting, DOB 08-Dec-1970, MRN [id number]  PCP:  Zola Button, MD   [place] [facility] Cardiologist:  Lenna Sciara, MD Referring MD: Kinnie Feil, MD   Chief Complaint/Reason for Referral:  Chest pain  ASSESSMENT:    1. Precordial pain     PLAN:    In order of problems listed above: 1.  Chest pain:  We will obtain a coronary CTA.  If the patient has mild obstructive coronary artery disease, they will require a statin (with goal LDL < 70) and aspirin, if they have high-grade disease we will need to consider optimal medical therapy and if symptoms are refractory to medical therapy, then a cardiac catheterization with possible PCI will be pursued to alleviate symptoms.  If they have high risk disease we will proceed directly to cardiac catheterization.  If his CTA demonstrates no obstructive coronary artery disease then we will stop the patient's aspirin and perhaps a GI evaluation or an evaluation for noncardiac chest pain as directed by the patient's PCP is in order.  If this demonstrates any type of coronary atherosclerosis then we will have the patient follow-up with [place].            Dispo:  Return if symptoms worsen or fail to improve.      Medication Adjustments/Labs and Tests Ordered: Current medicines are reviewed at length with the patient today.  Concerns regarding medicines are outlined above.  The following changes have been made:  no change   Labs/tests ordered: No orders of the defined types were placed in this encounter.   Medication Changes: No orders of the defined types were placed in this encounter.    Current medicines are reviewed at length with the patient today.  The patient does not have concerns regarding medicines.   History of Present Illness:    FOCUSED PROBLEM LIST:   1.  GERD  March 2023 consultation: The patient is a 52 y.o. male with the indicated medical history here  for recommendations regarding palpitations.  The patient was seen by her primary care provider recently.  She notes that she has had increasing frequency of palpitations that occur maybe 1-2 times a week.  They are associated with occasional chest discomfort.  They last less than 30 minutes.  There is no associated shortness of breath.  EKG demonstrates sinus rhythm with J-point elevation.  The patient tells me that he has had palpitations maybe once a week.  They can occur for several hours.  They are associated with some chest discomfort.  He occasionally gets chest discomfort when he walks but not reliably so.  He denies any shortness of breath, severe bleeding, paroxysmal atrial dyspnea, orthopnea, or peripheral edema.  He has required no hospitalizations or emergency room visits.  He does not believe that this chest pain is related to food intake but developed nausea in response to certain foods.  Plan: Check reflex TSH, echocardiogram, treadmill stress test, and monitor.  Today: In the interim the patient had an echocardiogram, stress test, monitor and TSH which were all reassuring.  A stress test was however nondiagnostic due to inability to achieve 80% of his maximal heart rate however he had an excellent exercise capacity.  The patient was seen by his primary care provider recently.  He developed sudden onset chest pressure lasted 10 minutes and then resolved spontaneously.  The pain was not exacerbated by exertion and not improved  by rest.  There was no radiation of the pain, associated shortness of breath, or nausea.  An EKG was done which showed no acute ischemic changes.  He started on aspirin 81 mg as well as as needed nitroglycerin.  The patient tells me on the day that he developed the symptoms he felt a chest ache which persisted for some time maybe about 15 minutes.  He has not had that makes sense.  His breathing has been fine.  He has had no lightheadedness or blacking out spells.  He  denies any peripheral edema.  Current Medications: Current Meds  Medication Sig   aspirin EC 81 MG tablet Take 1 tablet (81 mg total) by mouth daily. Swallow whole.   Blood Pressure KIT 1 application  by Does not apply route as needed.   nitroGLYCERIN (NITROSTAT) 0.3 MG SL tablet Place 1 tablet (0.3 mg total) under the tongue every 5 (five) minutes as needed for chest pain.     Allergies:    Patient has no known allergies.   Social History:   Social History   Tobacco Use   Smoking status: Never   Smokeless tobacco: Never  Substance Use Topics   Alcohol use: No   Drug use: No     Family Hx: Family History  Problem Relation Age of Onset   Healthy Mother    Healthy Father      Review of Systems:   Please see the history of present illness.    All other systems reviewed and are negative.     EKGs/Labs/Other Test Reviewed:    EKG:  EKG performed January 2024 that I personally reviewed demonstrates sinus bradycardia without ischemic changes  Prior CV studies:  ETT 2023: Non-diagnostic stress EKG for ischemia due to inability to achieve target heart rate (70% MPHR). No ischemic changes observed.  Excellent exercise capacity (12:00; 13.4 METS).  Normal BP response to exercise.   TTE 2023:  1. Left ventricular ejection fraction, by estimation, is 60 to 65%. The  left ventricle has normal function. The left ventricle has no regional  wall motion abnormalities. Left ventricular diastolic parameters were  normal.   2. Right ventricular systolic function is normal. The right ventricular  size is normal. There is normal pulmonary artery systolic pressure.   3. Some restriction to posterior leafelt motion although etiology of MR  not clear 3D TEE imaging may be useful if clinically indicated to r/o  cleft given relatively young age . The mitral valve is abnormal. Mild to  moderate mitral valve regurgitation.  No evidence of mitral stenosis.   4. The aortic valve is  tricuspid. Aortic valve regurgitation is not  visualized. No aortic stenosis is present.   5. The inferior vena cava is normal in size with greater than 50%  respiratory variability, suggesting right atrial pressure of 3 mmHg.   Monitor 2023: -1 run of Ventricular Tachycardia occurred lasting 10 beats with a max rate of 148 bpm (avg 134 bpm). Isolated SVEs were rare (<1.0%), SVE Couplets were rare (<1.0%), and no SVE Triplets were present. Isolated VEs were rare (<1.0%, 38), VE Couplets were rare (<1.0%, 4), and VE Triplets were rare (<1.0%, 1).  -Patient triggered events corresponded with sinus rhythm. -No atrial fibrillation, sustained ventricular tachyarrhythmias, or bradyarrhythmias were detected.  Other studies Reviewed: Review of the additional studies/records demonstrates: Chest CT 2014 without aortic atherosclerosis or coronary calcification  Recent Labs: 11/27/2021: TSH 0.908 08/25/2022: ALT 21; Hemoglobin 14.3; Platelets 217 10/09/2022: BUN  10; Creatinine, Ser 0.91; Potassium 4.4; Sodium 134   Recent Lipid Panel Lab Results  Component Value Date/Time   CHOL 160 06/02/2022 05:22 PM   TRIG 219 (H) 06/02/2022 05:22 PM   HDL 53 06/02/2022 05:22 PM   LDLCALC 71 06/02/2022 05:22 PM    Risk Assessment/Calculations:                Physical Exam:    VS:  BP (!) 110/52   Pulse (!) 59   Ht 6\' 2"  (1.88 m)   Wt 126 lb 9.6 oz (57.4 kg)   SpO2 98%   BMI 16.25 kg/m    Wt Readings from Last 3 Encounters:  10/13/22 126 lb 9.6 oz (57.4 kg)  10/09/22 128 lb (58.1 kg)  08/25/22 127 lb 12.8 oz (58 kg)    GENERAL:  No apparent distress, AOx3 HEENT:  No carotid bruits, +2 carotid impulses, no scleral icterus CAR: RRR no murmurs, gallops, rubs, or thrills RES:  Clear to auscultation bilaterally ABD:  Soft, nontender, nondistended, positive bowel sounds x 4 VASC:  +2 radial pulses, +2 carotid pulses, palpable pedal pulses NEURO:  CN 2-12 grossly intact; motor and sensory grossly  intact PSYCH:  No active depression or anxiety EXT:  No edema, ecchymosis, or cyanosis  Signed, Early Osmond, MD  10/13/2022 3:10 PM    [address], [place], [place]  [address] Phone: [phone]; Fax: [phone]   Note:  This document was prepared using Dragon voice recognition software and may include unintentional dictation errors.

## 2022-10-13 NOTE — Patient Instructions (Addendum)
Medication Instructions:  No changes *If you need a refill on your cardiac medications before your next appointment, please call your pharmacy*   Lab Work: none   Testing/Procedures: Coronary CTA - see instructions below   Follow-Up: As needed  Other Instructions   Your cardiac CT will be scheduled at  [facility] [place], [place] [address] [phone]  Please arrive at the [facility] and Children's Entrance (Entrance C2) of [facility] 30 minutes prior to test start time. You can use the FREE valet parking offered at entrance C (encouraged to control the heart rate for the test)  Proceed to the [facility] Radiology Department (first floor) to check-in and test prep.  All radiology patients and guests should use entrance C2 at [facility], accessed from [facility], even though the hospital's physical address listed is [address]     Please follow these instructions carefully (unless otherwise directed):  Hold all erectile dysfunction medications at least 3 days (72 hrs) prior to test. (Ie viagra, cialis, sildenafil, tadalafil, etc) We will administer nitroglycerin during this exam.   On the Night Before the Test: Be sure to Drink plenty of water. Do not consume any caffeinated/decaffeinated beverages or chocolate 12 hours prior to your test. Do not take any antihistamines 12 hours prior to your test. (Citirizine)  On the Day of the Test: Drink plenty of water until 1 hour prior to the test. Do not eat any food 1 hour prior to test. You may take your regular medications prior to the test.   After the Test: Drink plenty of water. After receiving IV contrast, you may experience a mild flushed feeling. This is normal. On occasion, you may experience a mild rash up to 24 hours after the test. This is not dangerous. If this occurs, you can take Benadryl 25 mg and increase your fluid intake. If you  experience trouble breathing, this can be serious. If it is severe call 911 IMMEDIATELY. If it is mild, please call our office. If you take any of these medications: Glipizide/Metformin, Avandament, Glucavance, please do not take 48 hours after completing test unless otherwise instructed.  We will call to schedule your test 2-4 weeks out understanding that some insurance companies will need an authorization prior to the service being performed.   For non-scheduling related questions, please contact the cardiac imaging nurse navigator should you have any questions/concerns: Marchia Bond, Cardiac Imaging Nurse Navigator Gordy Clement, Cardiac Imaging Nurse Navigator [facility] Direct Office Dial: [phone]   For scheduling needs, including cancellations and rescheduling, please call [place], [phone].

## 2022-10-21 NOTE — Patient Instructions (Addendum)
It was nice seeing you today!  Try simethicone (Gas-X) as needed for gas discomfort.  Try Miralax once a day as needed for constipation.  Stay well, Zola Button, MD [place] [phone]  --  Make sure to check out at the front desk before you leave today.  Please arrive at least 15 minutes prior to your scheduled appointments.  If you had blood work today, I will send you a MyChart message or a letter if results are normal. Otherwise, I will give you a call.  If you had a referral placed, they will call you to set up an appointment. Please give [place] a call if you don't hear back in the next 2 weeks.  If you need additional refills before your next appointment, please call your pharmacy first.

## 2022-10-21 NOTE — Progress Notes (Signed)
    SUBJECTIVE:   CHIEF COMPLAINT / HPI:  No chief complaint on file.   I last saw this patient on 1/26 for episode of chest pain, referred to cardiology and patient was evaluated on 1/30 by cardiology with plan for coronary CTA.  Today reports he has had no further episodes of chest pain.  He is still having some occasional abdominal discomfort which he thinks is related to gas.  No abdominal pain.  Flatulence is sometimes smelly. Denies fever, chills, diarrhea. He has had some issues with constipation, only having bowel movements every 2 to 3 days.  No hard stools.  PERTINENT  PMH / PSH: GERD, H. pylori infection  Patient Care Team: Zola Button, MD as PCP - General (Family Medicine)   OBJECTIVE:   BP 96/66   Pulse 68   Ht 6\' 2"  (1.88 m)   Wt 127 lb 8 oz (57.8 kg)   SpO2 100%   BMI 16.37 kg/m   Physical Exam Constitutional:      General: He is not in acute distress. HENT:     Head: Normocephalic and atraumatic.  Cardiovascular:     Rate and Rhythm: Normal rate and regular rhythm.  Pulmonary:     Effort: Pulmonary effort is normal. No respiratory distress.     Breath sounds: Normal breath sounds.  Abdominal:     General: Bowel sounds are normal.     Palpations: Abdomen is soft.     Tenderness: There is no abdominal tenderness.  Neurological:     Mental Status: He is alert.         10/23/2022    8:56 AM  Depression screen PHQ 2/9  Decreased Interest 0  Down, Depressed, Hopeless 0  PHQ - 2 Score 0  Altered sleeping 0  Tired, decreased energy 0  Change in appetite 0  Feeling bad or failure about yourself  0  Trouble concentrating 0  Moving slowly or fidgety/restless 0  Suicidal thoughts 0  PHQ-9 Score 0     {Show previous vital signs (optional):23777}    ASSESSMENT/PLAN:   1. Chest pain, unspecified type No further chest pain.  Undergoing coronary CTA with cardiology.  2. Abdominal gas pain Not having any true pain, will trial simethicone -  simethicone (GAS-X) 80 MG chewable tablet; Chew 1 tablet (80 mg total) by mouth every 6 (six) hours as needed for flatulence.  Dispense: 30 tablet; Refill: 0  3. Constipation, unspecified constipation type - polyethylene glycol powder (GLYCOLAX/MIRALAX) 17 GM/SCOOP powder; Take 17 g by mouth daily as needed.  Dispense: 500 g; Refill: 0    Return in about 3 months (around 01/21/2023) for physical.   Zola Button, MD [place]

## 2022-10-23 ENCOUNTER — Ambulatory Visit (INDEPENDENT_AMBULATORY_CARE_PROVIDER_SITE_OTHER): Payer: Medicaid Other | Admitting: Family Medicine

## 2022-10-23 ENCOUNTER — Encounter: Payer: Self-pay | Admitting: Family Medicine

## 2022-10-23 VITALS — BP 96/66 | HR 68 | Ht 74.0 in | Wt 127.5 lb

## 2022-10-23 DIAGNOSIS — R141 Gas pain: Secondary | ICD-10-CM | POA: Diagnosis not present

## 2022-10-23 DIAGNOSIS — R079 Chest pain, unspecified: Secondary | ICD-10-CM

## 2022-10-23 DIAGNOSIS — K5900 Constipation, unspecified: Secondary | ICD-10-CM | POA: Diagnosis not present

## 2022-10-23 HISTORY — DX: Constipation, unspecified: K59.00

## 2022-10-23 MED ORDER — SIMETHICONE 80 MG PO CHEW: 80.0000 mg | CHEWABLE_TABLET | Freq: Four times a day (QID) | ORAL | 0 refills | Status: DC | PRN

## 2022-10-23 MED ORDER — POLYETHYLENE GLYCOL 3350 17 GM/SCOOP PO POWD: 17.0000 g | Freq: Every day | ORAL | 0 refills | Status: DC | PRN

## 2022-12-03 ENCOUNTER — Encounter: Payer: Self-pay | Admitting: Family Medicine

## 2022-12-03 ENCOUNTER — Telehealth: Payer: Self-pay | Admitting: Family Medicine

## 2022-12-03 NOTE — Telephone Encounter (Signed)
PAC invitation letter printed and handed to [place] to mail as certified letter.

## 2022-12-03 NOTE — Telephone Encounter (Signed)
Called patient to discuss [facility] Patient Advisory Committee. Confirmed name and DOB.  Discussed Patient Advisory Committee (PAC). The PAC includes patients, faculty, residents, and staff. The PAC meets to discuss issues pertinent to [facility] patients. The PAC will meet 2-4 times per year, either virtually or in person. Lunch will be provided over these meetings. There is no other monetary benefit to attending these meetings. Benefits include having influence over changes at the [facility] and improving the experience for all at the [facility]. At the end of the conversation the patient accepted the invitation to the [facility] PAC. Routing message to Christie Reynolds, MHA to send letter with written details.   

## 2023-01-12 ENCOUNTER — Ambulatory Visit (INDEPENDENT_AMBULATORY_CARE_PROVIDER_SITE_OTHER): Payer: Medicaid Other | Admitting: Family Medicine

## 2023-01-12 ENCOUNTER — Other Ambulatory Visit: Payer: Self-pay | Admitting: Family Medicine

## 2023-01-12 VITALS — BP 104/56 | HR 70 | Temp 98.1°F | Ht 74.0 in | Wt 130.0 lb

## 2023-01-12 DIAGNOSIS — J029 Acute pharyngitis, unspecified: Secondary | ICD-10-CM | POA: Diagnosis not present

## 2023-01-12 DIAGNOSIS — J392 Other diseases of pharynx: Secondary | ICD-10-CM | POA: Diagnosis present

## 2023-01-12 LAB — POCT RAPID STREP A (OFFICE): Rapid Strep A Screen: NEGATIVE

## 2023-01-12 MED ORDER — FLUTICASONE PROPIONATE 50 MCG/ACT NA SUSP: 2.0000 | Freq: Every day | NASAL | 1 refills | Status: DC

## 2023-01-12 MED ORDER — OMEPRAZOLE 40 MG PO CPDR: 40.0000 mg | DELAYED_RELEASE_CAPSULE | Freq: Every day | ORAL | 0 refills | Status: DC

## 2023-01-12 NOTE — Patient Instructions (Signed)
It was great seeing you today!  For your throat discomfort I recommend taking the Omeprazole 40mg  daily which I have sent in for you. I also recommend using Flonase 2 sprays in each nostril for the first week and then 1 spray daily.  Return if symptoms persist beyond the next couple of weeks, but if you need to be seen earlier than that for any new issues we're happy to fit you in, just give [place] a call!  Feel free to call with any questions or concerns at any time, at [phone].   Take care,  Dr. Cora Collum [facility]

## 2023-01-12 NOTE — Progress Notes (Unsigned)
    SUBJECTIVE:   CHIEF COMPLAINT / HPI:   Patient presents for discomfort in his throat which started 3 days ago. Has been able to eat and drink well without pain, just feels uncomfortable. Denies fever or chills. Does feel increased congestion for the past 2 days. Denies cough. Once in a while has burning in his chest. Takes Omeprazole as needed.   PERTINENT  PMH / PSH: Reviewed   OBJECTIVE:   BP (!) 104/56   Pulse 70   Temp 98.1 F (36.7 C)   Ht 6\' 2"  (1.88 m)   Wt 130 lb (59 kg)   SpO2 100%   BMI 16.69 kg/m    Physical exam General: well appearing, NAD HEENT: Oropharynx non erythematous and without lesions or exudates  Cardiovascular: RRR, no murmurs Lungs: CTAB. Normal WOB Abdomen: soft, non-distended, non-tender Skin: warm, dry. No edema  ASSESSMENT/PLAN:   Throat irritation 3 days in duration. More of an uncomfortable feeling. Able to eat and drink without pain. Does feel occasional burning in his chest. Strep test negative. Symptoms possibly from acid reflux. Since he does have some increased congestion will also treat for potential post nasal drip. He has been using Omeprazole 20mg  intermittently, refilled at 40mg  to use daily over the next month. Also prescribed Flonase. If no improvement recommended returning for further evaluation.  - Omeprazole 40mg  daily - Flonase 2 sprays in each nostril daily for first week followed by 1 spray daily    Cora Collum, DO [facility]

## 2023-01-13 DIAGNOSIS — J392 Other diseases of pharynx: Secondary | ICD-10-CM | POA: Insufficient documentation

## 2023-01-13 NOTE — Assessment & Plan Note (Signed)
3 days in duration. More of an uncomfortable feeling. Able to eat and drink without pain. Does feel occasional burning in his chest. Strep test negative. Symptoms possibly from acid reflux. Since he does have some increased congestion will also treat for potential post nasal drip. He has been using Omeprazole 20mg  intermittently, refilled at 40mg  to use daily over the next month. Also prescribed Flonase. If no improvement recommended returning for further evaluation.  - Omeprazole 40mg  daily - Flonase 2 sprays in each nostril daily for first week followed by 1 spray daily

## 2023-02-03 ENCOUNTER — Other Ambulatory Visit: Payer: Self-pay | Admitting: Family Medicine

## 2023-02-26 ENCOUNTER — Ambulatory Visit (INDEPENDENT_AMBULATORY_CARE_PROVIDER_SITE_OTHER): Payer: Medicaid Other | Admitting: Family Medicine

## 2023-02-26 ENCOUNTER — Encounter: Payer: Self-pay | Admitting: Family Medicine

## 2023-02-26 VITALS — BP 118/60 | HR 85 | Ht 74.0 in | Wt 130.0 lb

## 2023-02-26 DIAGNOSIS — Z0000 Encounter for general adult medical examination without abnormal findings: Secondary | ICD-10-CM | POA: Diagnosis not present

## 2023-02-26 DIAGNOSIS — Z125 Encounter for screening for malignant neoplasm of prostate: Secondary | ICD-10-CM | POA: Diagnosis not present

## 2023-02-26 DIAGNOSIS — R7303 Prediabetes: Secondary | ICD-10-CM

## 2023-02-26 DIAGNOSIS — R739 Hyperglycemia, unspecified: Secondary | ICD-10-CM | POA: Diagnosis not present

## 2023-02-26 MED ORDER — TRIAMCINOLONE ACETONIDE 0.1 % MT PSTE
PASTE | Freq: Two times a day (BID) | OROMUCOSAL | 0 refills | Status: DC | PRN
Start: 2023-02-26 — End: 2023-12-26

## 2023-02-26 NOTE — Patient Instructions (Signed)
It was nice seeing you today!  Blood work today.  See me in 3 months or whenever is a good for you.  Stay well, Carl Butner, MD [facility] [phone]  --  Make sure to check out at the front desk before you leave today.  Please arrive at least 15 minutes prior to your scheduled appointments.  If you had blood work today, I will send you a MyChart message or a letter if results are normal. Otherwise, I will give you a call.  If you had a referral placed, they will call you to set up an appointment. Please give us a call if you don't hear back in the next 2 weeks.  If you need additional refills before your next appointment, please call your pharmacy first.  

## 2023-02-26 NOTE — Progress Notes (Signed)
    SUBJECTIVE:   CHIEF COMPLAINT / HPI:  Chief Complaint  Patient presents with   Annual Exam    Tries to go to the gym 2-3 times per week, 45-60 min at a time. Reports eating plenty of fruits and vegetables.  Reports constipation and abdominal gas pain has resolved.  Throat discomfort is also resolved.  Denies chest pain.  Family history of prostate cancer in father.  He is married with 3 young kids.  He was recently in [place] with his family.  He will be going to [place] later this summer.  PERTINENT  PMH / PSH: GERD, H. pylori infection  Patient Care Team: Littie Deeds, MD as PCP - General (Family Medicine)   OBJECTIVE:   BP 118/60   Pulse 85   Ht 6\' 2"  (1.88 m)   Wt 130 lb (59 kg)   SpO2 99%   BMI 16.69 kg/m   Physical Exam Constitutional:      General: He is not in acute distress. HENT:     Head: Normocephalic and atraumatic.     Mouth/Throat:     Mouth: Mucous membranes are moist.     Pharynx: Oropharynx is clear. No oropharyngeal exudate or posterior oropharyngeal erythema.  Eyes:     Extraocular Movements: Extraocular movements intact.  Cardiovascular:     Rate and Rhythm: Normal rate and regular rhythm.  Pulmonary:     Effort: Pulmonary effort is normal. No respiratory distress.     Breath sounds: Normal breath sounds.  Abdominal:     General: Bowel sounds are normal.     Palpations: Abdomen is soft.     Tenderness: There is no abdominal tenderness.  Neurological:     Mental Status: He is alert.         02/26/2023    2:54 PM  Depression screen PHQ 2/9  Decreased Interest 0  Down, Depressed, Hopeless 0  PHQ - 2 Score 0  Altered sleeping 0  Tired, decreased energy 0  Change in appetite 0  Feeling bad or failure about yourself  0  Trouble concentrating 0  Moving slowly or fidgety/restless 0  Suicidal thoughts 0  PHQ-9 Score 0  Difficult doing work/chores Not difficult at all     {Show previous vital signs  (optional):23777}    ASSESSMENT/PLAN:   1. Encounter for routine history and physical examination of adult Counseled on diet and exercise, handout provided.  Patient requests lab work done today. - CBC - Comprehensive metabolic panel  2. Hyperglycemia - Hemoglobin A1c  3. Screening for malignant neoplasm of prostate Family history of prostate cancer in his father.  Counseled on prostate cancer screening and patient desires screening. - PSA    HCM - reports colonoscopy done in [place] - discussed shingles vaccine, will plan to obtain at pharmacy - reports he had the Tdap in 2019 in [place]  Unfortunately prior records request did not show a vaccination record or colonoscopy  Return in about 1 year (around 02/26/2024) for physical.   Littie Deeds, MD [facility]

## 2023-02-27 DIAGNOSIS — R7303 Prediabetes: Secondary | ICD-10-CM | POA: Insufficient documentation

## 2023-02-27 LAB — COMPREHENSIVE METABOLIC PANEL
ALT: 22 IU/L (ref 0–44)
AST: 29 IU/L (ref 0–40)
Albumin: 4.1 g/dL (ref 4.1–5.1)
Alkaline Phosphatase: 65 IU/L (ref 44–121)
BUN/Creatinine Ratio: 15 (ref 9–20)
BUN: 15 mg/dL (ref 6–24)
Bilirubin Total: 0.5 mg/dL (ref 0.0–1.2)
CO2: 23 mmol/L (ref 20–29)
Calcium: 8.9 mg/dL (ref 8.7–10.2)
Chloride: 98 mmol/L (ref 96–106)
Creatinine, Ser: 0.98 mg/dL (ref 0.76–1.27)
Globulin, Total: 2.3 g/dL (ref 1.5–4.5)
Glucose: 75 mg/dL (ref 70–99)
Potassium: 4.4 mmol/L (ref 3.5–5.2)
Sodium: 133 mmol/L — ABNORMAL LOW (ref 134–144)
Total Protein: 6.4 g/dL (ref 6.0–8.5)
eGFR: 94 mL/min/{1.73_m2} (ref >59)

## 2023-02-27 LAB — CBC
Hematocrit: 40.4 % (ref 37.5–51.0)
Hemoglobin: 13.3 g/dL (ref 13.0–17.7)
MCH: 29.5 pg (ref 26.6–33.0)
MCHC: 32.9 g/dL (ref 31.5–35.7)
MCV: 90 fL (ref 79–97)
Platelets: 228 10*3/uL (ref 150–450)
RBC: 4.51 x10E6/uL (ref 4.14–5.80)
RDW: 13.2 % (ref 11.6–15.4)
WBC: 4.1 10*3/uL (ref 3.4–10.8)

## 2023-02-27 LAB — PSA: Prostate Specific Ag, Serum: 0.2 ng/mL (ref 0.0–4.0)

## 2023-02-27 LAB — HEMOGLOBIN A1C
Est. average glucose Bld gHb Est-mCnc: 117 mg/dL
Hgb A1c MFr Bld: 5.7 % — ABNORMAL HIGH (ref 4.8–5.6)

## 2023-04-15 ENCOUNTER — Ambulatory Visit (INDEPENDENT_AMBULATORY_CARE_PROVIDER_SITE_OTHER): Payer: Medicaid Other | Admitting: Family Medicine

## 2023-04-15 ENCOUNTER — Ambulatory Visit: Payer: Medicaid Other | Admitting: Student

## 2023-04-15 ENCOUNTER — Other Ambulatory Visit: Payer: Self-pay

## 2023-04-15 VITALS — BP 83/48 | HR 75 | Ht 74.0 in | Wt 126.2 lb

## 2023-04-15 DIAGNOSIS — L80 Vitiligo: Secondary | ICD-10-CM

## 2023-04-15 DIAGNOSIS — K219 Gastro-esophageal reflux disease without esophagitis: Secondary | ICD-10-CM | POA: Diagnosis not present

## 2023-04-15 DIAGNOSIS — I959 Hypotension, unspecified: Secondary | ICD-10-CM

## 2023-04-15 MED ORDER — PANTOPRAZOLE SODIUM 40 MG PO TBEC: 40.0000 mg | DELAYED_RELEASE_TABLET | Freq: Every day | ORAL | 1 refills | Status: DC

## 2023-04-15 NOTE — Patient Instructions (Signed)
I have placed a referral to dermatology  Please take protonix daily and follow up in 1 month

## 2023-04-15 NOTE — Progress Notes (Signed)
  SUBJECTIVE:   CHIEF COMPLAINT / HPI:   Vitiligo - was referred to derm in 2023 but never heard back - requests another referral to Dr. Wallace Cullens  Stomach discomfort, low appetite, globus sensation in throat x1wk -Denies fevers, vomiting, diarrhea, constipation -Denies pain -Tried prilosec without much relief -eating, drinking normally.  Symptoms are constant and not associated with eating   PERTINENT  PMH / PSH:   Past Medical History:  Diagnosis Date   Constipation 10/23/2022   Hepatitis A immune 11/26/2021    OBJECTIVE:  BP (!) 83/48   Pulse 75   Ht 6\' 2"  (1.88 m)   Wt 126 lb 3.2 oz (57.2 kg)   SpO2 100%   BMI 16.20 kg/m   General: NAD, pleasant, able to participate in exam Cardiac: RRR, no murmurs auscultated Respiratory: CTAB, normal WOB Abdomen: soft, non-tender, non-distended, normoactive bowel sounds Extremities: warm and well perfused, no edema or cyanosis Skin: warm and dry, no rashes noted Neuro: alert, no obvious focal deficits, speech normal Psych: Normal affect and mood  ASSESSMENT/PLAN:   1. Gastroesophageal reflux disease, unspecified whether esophagitis present Presents with globus sensation as well as generalized stomach discomfort without pain, heartburn, vomiting which I feel is possibly related to GERD versus a benign finding versus related to anxiety.  Did not respond well to Flonase in the past and is not currently feeling congested making allergic etiology less likely.  Tested negative for H. pylori in the past.  Will trial Protonix instead of Prilosec for 1 month and follow-up if no improvement.  Reassured that he is eating and drinking normally with no changes to his bowel movements.  Benign abdominal exam.  He is not a smoker or drinker.  - pantoprazole (PROTONIX) 40 MG tablet; Take 1 tablet (40 mg total) by mouth daily.  Dispense: 30 tablet; Refill: 1  2. Vitiligo Placed referral to Dr. Zannie Kehr per patient request as previous referral did not  go through - Ambulatory referral to Dermatology  3. Hypotension, unspecified hypotension type Blood pressure 80s over 40s in clinic today, patient asymptomatic and has been noted to be hypotensive in the past.  Weight stable.  Continue to monitor, recheck at next visit  Meds ordered this encounter  Medications   pantoprazole (PROTONIX) 40 MG tablet    Sig: Take 1 tablet (40 mg total) by mouth daily.    Dispense:  30 tablet    Refill:  1   Return in about 4 weeks (around 05/13/2023).  Vonna Drafts, MD [facility]

## 2023-05-07 ENCOUNTER — Other Ambulatory Visit: Payer: Self-pay | Admitting: Family Medicine

## 2023-05-07 DIAGNOSIS — K219 Gastro-esophageal reflux disease without esophagitis: Secondary | ICD-10-CM

## 2023-06-22 ENCOUNTER — Other Ambulatory Visit: Payer: Self-pay

## 2023-06-22 ENCOUNTER — Encounter: Payer: Self-pay | Admitting: Student

## 2023-06-22 ENCOUNTER — Ambulatory Visit (INDEPENDENT_AMBULATORY_CARE_PROVIDER_SITE_OTHER): Payer: Medicaid Other | Admitting: Student

## 2023-06-22 VITALS — BP 111/71 | HR 72 | Ht 74.0 in | Wt 130.0 lb

## 2023-06-22 DIAGNOSIS — R1013 Epigastric pain: Secondary | ICD-10-CM | POA: Diagnosis present

## 2023-06-22 DIAGNOSIS — R141 Gas pain: Secondary | ICD-10-CM

## 2023-06-22 MED ORDER — SIMETHICONE 80 MG PO CHEW: 80.0000 mg | CHEWABLE_TABLET | Freq: Four times a day (QID) | ORAL | Status: DC | PRN

## 2023-06-22 NOTE — Progress Notes (Unsigned)
    SUBJECTIVE:   CHIEF COMPLAINT / HPI:   Abdominal Pain Seen by Dr. Barb Merino 2 months ago and diagnosed with GERD given hx of globus sensation, abdominal pain, heartburn, and vomiting. H pylori testing negative in the past. He has been on Protonix since his visit in August. His symptoms more or less resolved after that visit, but have come back. Worse over the past week Terrible gas for three days, now persistent generalized abdominal discomfort. No fevers or vomiting. Does have a bit of nausea and perhaps increased stool output but it is not watery, bloody, or tarry. Seems normal, just perhaps a bit increased in volume. Denies any significant NSAID use.  Colonoscopy in [place] in 2021 at [facility] which he reports was normal. Never had an EGD.   PERTINENT  PMH / PSH: Prediabetes   OBJECTIVE:   BP 111/71   Pulse 72   Ht 6\' 2"  (1.88 m)   Wt 130 lb (59 kg)   SpO2 100%   BMI 16.69 kg/m   Gen: Thin male, well-appearing and NAD. Diffuse vitiligo patches Pulm: Normal WOB on RA, lung sounds normal throughout Abd: Non-tender and non-distended, there is no mass or organomegaly   ASSESSMENT/PLAN:   Abdominal discomfort, epigastric Epigastric pain. Benign lab workup 2 months ago and benign exam today. No obvious red flags, but I do maintain a higher index of suspicion for PUD or something more insidious such as gastric cancer in this patient with a history of H pylori and Age >50. Given this, I suspect he may benefit from EGD evaluation. - Referral to GI for evaluation and consideration of EGD - Have faxed a records request to [facility] for his colonoscopy report  - Continue protonix for now - Adding simethicone given concomitant gas pains      J Dorothyann Gibbs, MD [facility]

## 2023-06-22 NOTE — Patient Instructions (Signed)
Mr. Yousif, Edelson to meet you! I think we've gotten to a point where we *may* need our GI docs to take a look at your stomach from the top-down given your age and history of H pylori. They will call you to set up an appointment. I've also faxed off a records request to your doctor in [place] to try and get the records from your recent colonoscopy.  Eliezer Mccoy, MD

## 2023-06-23 DIAGNOSIS — R1013 Epigastric pain: Secondary | ICD-10-CM | POA: Insufficient documentation

## 2023-06-23 NOTE — Assessment & Plan Note (Addendum)
Epigastric pain. Benign lab workup 2 months ago and benign exam today. No obvious red flags, but I do maintain a higher index of suspicion for PUD or something more insidious such as gastric cancer in this patient with a history of H pylori and Age >50. Given this, I suspect he may benefit from EGD evaluation. - Referral to GI for evaluation and consideration of EGD - Have faxed a records request to [facility] for his colonoscopy report  - Continue protonix for now - Adding simethicone given concomitant gas pains

## 2023-06-29 ENCOUNTER — Encounter: Payer: Self-pay | Admitting: Nurse Practitioner

## 2023-07-08 ENCOUNTER — Telehealth: Payer: Self-pay | Admitting: Family Medicine

## 2023-07-08 NOTE — Telephone Encounter (Signed)
HIPAA compliant callback message left.  He is a member of the [facility] Patient Advisory Committee.  Reminding him of the scheduled meeting for 07/23/23 at 12:30 pm at [facility].

## 2023-08-16 ENCOUNTER — Encounter: Payer: Self-pay | Admitting: Family Medicine

## 2023-08-16 ENCOUNTER — Ambulatory Visit: Payer: Medicaid Other | Admitting: Family Medicine

## 2023-08-16 VITALS — BP 103/62 | HR 64 | Ht 74.0 in | Wt 129.5 lb

## 2023-08-16 DIAGNOSIS — M25562 Pain in left knee: Secondary | ICD-10-CM

## 2023-08-16 DIAGNOSIS — M25561 Pain in right knee: Secondary | ICD-10-CM

## 2023-08-16 DIAGNOSIS — K219 Gastro-esophageal reflux disease without esophagitis: Secondary | ICD-10-CM | POA: Diagnosis present

## 2023-08-16 MED ORDER — DICLOFENAC SODIUM 1 % EX GEL: 2.0000 g | Freq: Four times a day (QID) | CUTANEOUS | 1 refills | Status: DC

## 2023-08-16 MED ORDER — SUCRALFATE 1 G PO TABS: 1.0000 g | ORAL_TABLET | Freq: Three times a day (TID) | ORAL | 1 refills | Status: DC

## 2023-08-16 NOTE — Patient Instructions (Signed)
Please try taking carafate (sucralfate) up to four times daily and follow up with the GI doctor

## 2023-08-16 NOTE — Progress Notes (Signed)
    SUBJECTIVE:   CHIEF COMPLAINT / HPI:   Stomach pain Previously seen 8/1 and started on protonix (switched from prilosec)  Seen again by Dr. Marisue Humble 10/8 and referred to GI, continued on protonix, and added simethicone Referral was placed to [facility] - has appt scheduled in January  Symptoms have persisted since last visit - protonix and simethicone provide temporary relief but not resolution No fevers, vomiting, diarrhea Currently denies abd pain   PERTINENT  PMH / PSH: hx h pylori (treated), GERD  OBJECTIVE:   BP 103/62   Pulse 64   Ht 6\' 2"  (1.88 m)   Wt 129 lb 8 oz (58.7 kg)   SpO2 99%   BMI 16.63 kg/m   General: NAD, pleasant, able to participate in exam Cardiac: RRR, no murmurs auscultated Respiratory: CTAB, normal WOB Abdomen: soft, non-tender, non-distended, normoactive bowel sounds Extremities: warm and well perfused, no edema or cyanosis Skin: warm and dry, no rashes noted Neuro: alert, no obvious focal deficits, speech normal Psych: Normal affect and mood  ASSESSMENT/PLAN:   Assessment & Plan Gastroesophageal reflux disease, unspecified whether esophagitis present Trial carafate in addition to protonix, until able to see GI.  Considered retesting for H. pylori but given that he has GI follow-up scheduled within the next month and he has been on PPIs recently feel that he would most benefit from waiting until his GI appointment.  If Carafate is not effective may consider adding Pepcid though he has tried this in the past.   Vonna Drafts, MD [facility]

## 2023-08-16 NOTE — Assessment & Plan Note (Addendum)
Trial carafate in addition to protonix, until able to see GI.  Considered retesting for H. pylori but given that he has GI follow-up scheduled within the next month and he has been on PPIs recently feel that he would most benefit from waiting until his GI appointment.  If Carafate is not effective may consider adding Pepcid though he has tried this in the past.

## 2023-09-03 ENCOUNTER — Emergency Department (HOSPITAL_BASED_OUTPATIENT_CLINIC_OR_DEPARTMENT_OTHER): Payer: Medicaid Other

## 2023-09-03 ENCOUNTER — Other Ambulatory Visit: Payer: Self-pay

## 2023-09-03 ENCOUNTER — Encounter (HOSPITAL_BASED_OUTPATIENT_CLINIC_OR_DEPARTMENT_OTHER): Payer: Self-pay

## 2023-09-03 ENCOUNTER — Emergency Department (HOSPITAL_BASED_OUTPATIENT_CLINIC_OR_DEPARTMENT_OTHER)
Admission: EM | Admit: 2023-09-03 | Discharge: 2023-09-03 | Disposition: A | Discharge status: Home / Self Care | Payer: Medicaid Other | Attending: Emergency Medicine | Admitting: Emergency Medicine

## 2023-09-03 DIAGNOSIS — Z7982 Long term (current) use of aspirin: Secondary | ICD-10-CM | POA: Diagnosis not present

## 2023-09-03 DIAGNOSIS — M545 Low back pain, unspecified: Secondary | ICD-10-CM | POA: Insufficient documentation

## 2023-09-03 LAB — CBC
HCT: 40.3 % (ref 39.0–52.0)
Hemoglobin: 13.8 g/dL (ref 13.0–17.0)
MCH: 29.7 pg (ref 26.0–34.0)
MCHC: 34.2 g/dL (ref 30.0–36.0)
MCV: 86.7 fL (ref 80.0–100.0)
Platelets: 246 10*3/uL (ref 150–400)
RBC: 4.65 MIL/uL (ref 4.22–5.81)
RDW: 12.7 % (ref 11.5–15.5)
WBC: 4.3 10*3/uL (ref 4.0–10.5)
nRBC: 0 % (ref 0.0–0.2)

## 2023-09-03 LAB — BASIC METABOLIC PANEL
Anion gap: 5 (ref 5–15)
BUN: 13 mg/dL (ref 6–20)
CO2: 25 mmol/L (ref 22–32)
Calcium: 8.7 mg/dL — ABNORMAL LOW (ref 8.9–10.3)
Chloride: 102 mmol/L (ref 98–111)
Creatinine, Ser: 1.03 mg/dL (ref 0.61–1.24)
GFR, Estimated: >60 mL/min (ref >60)
Glucose, Bld: 92 mg/dL (ref 70–99)
Potassium: 3.9 mmol/L (ref 3.5–5.1)
Sodium: 132 mmol/L — ABNORMAL LOW (ref 135–145)

## 2023-09-03 LAB — URINALYSIS, ROUTINE W REFLEX MICROSCOPIC
Bilirubin Urine: NEGATIVE
Glucose, UA: NEGATIVE mg/dL
Hgb urine dipstick: NEGATIVE
Ketones, ur: NEGATIVE mg/dL
Leukocytes,Ua: NEGATIVE
Nitrite: NEGATIVE
Protein, ur: NEGATIVE mg/dL
Specific Gravity, Urine: 1.01 (ref 1.005–1.030)
pH: 6 (ref 5.0–8.0)

## 2023-09-03 MED ORDER — METHOCARBAMOL 500 MG PO TABS: 500.0000 mg | ORAL_TABLET | Freq: Three times a day (TID) | ORAL | 0 refills | Status: DC | PRN

## 2023-09-03 MED ORDER — LIDOCAINE 5 % EX PTCH
1.0000 | MEDICATED_PATCH | CUTANEOUS | Status: DC
  Administered 2023-09-03: 1 via TRANSDERMAL
  Filled 2023-09-03: qty 1

## 2023-09-03 MED ORDER — KETOROLAC TROMETHAMINE 15 MG/ML IJ SOLN
15.0000 mg | Freq: Once | INTRAMUSCULAR | Status: AC
  Administered 2023-09-03: 15 mg via INTRAMUSCULAR
  Filled 2023-09-03: qty 1

## 2023-09-03 NOTE — ED Provider Notes (Signed)
[facility] Provider Note   CSN: [id number] Arrival date & time: 09/03/23  0805     History  Chief Complaint  Patient presents with   Back Pain    Juan Palmer is a 52 y.o. male.   Back Pain Healthy 52 year old male presenting for left lower back pain.  Started yesterday afternoon while he was in the car.  She was cramping in his left lower back.  No radiating, went to work yesterday and it was worse after going to work.  He feels like it is somewhat worse with movement.  Worse with bending as well.  No radicular pain, no weakness or numbness or saddle anesthesia.  No fevers or chills.  No urinary symptoms.  No abdominal pain or vomiting.  No history of kidney stones.     Home Medications Prior to Admission medications   Medication Sig Start Date End Date Taking? Authorizing Provider  methocarbamol (ROBAXIN) 500 MG tablet Take 1 tablet (500 mg total) by mouth 3 (three) times daily as needed for muscle spasms. 09/03/23  Yes Laurence Spates, MD  aspirin EC 81 MG tablet Take 1 tablet (81 mg total) by mouth daily. Swallow whole. 10/09/22   Littie Deeds, MD  Blood Pressure KIT 1 application  by Does not apply route as needed. 06/02/22   Littie Deeds, MD  cetirizine (ZYRTEC ALLERGY) 10 MG tablet Take 1 tablet (10 mg total) by mouth at bedtime. 02/20/22 05/21/22  Theadora Rama Scales, PA-C  diclofenac Sodium (VOLTAREN) 1 % GEL Apply 2 g topically 4 (four) times daily. 08/16/23   Vonna Drafts, MD  fluticasone (FLONASE) 50 MCG/ACT nasal spray SPRAY 2 SPRAYS INTO EACH NOSTRIL EVERY DAY 02/03/23   Cora Collum, DO  nitroGLYCERIN (NITROSTAT) 0.3 MG SL tablet Place 1 tablet (0.3 mg total) under the tongue every 5 (five) minutes as needed for chest pain. 10/09/22   Littie Deeds, MD  pantoprazole (PROTONIX) 40 MG tablet TAKE 1 TABLET BY MOUTH EVERY DAY 05/07/23   Vonna Drafts, MD  polyethylene glycol powder (GLYCOLAX/MIRALAX) 17 GM/SCOOP powder Take  17 g by mouth daily as needed. 10/23/22   Littie Deeds, MD  simethicone (GAS-X) 80 MG chewable tablet Chew 1 tablet (80 mg total) by mouth every 6 (six) hours as needed for flatulence. 06/22/23   Alicia Amel, MD  sucralfate (CARAFATE) 1 g tablet Take 1 tablet (1 g total) by mouth 4 (four) times daily -  with meals and at bedtime. 08/16/23   Vonna Drafts, MD  triamcinolone (KENALOG) 0.1 % paste Use as directed in the mouth or throat 2 (two) times daily as needed. 02/26/23   Littie Deeds, MD      Allergies    Patient has no known allergies.    Review of Systems   Review of Systems  Musculoskeletal:  Positive for back pain.  Review of systems completed and notable as per HPI.  ROS otherwise negative.   Physical Exam Updated Vital Signs BP 120/76   Pulse 70   Temp 98.2 F (36.8 C) (Oral)   Resp 17   Ht 6\' 2"  (1.88 m)   Wt 57.2 kg   SpO2 100%   BMI 16.18 kg/m  Physical Exam Vitals and nursing note reviewed.  Constitutional:      General: He is not in acute distress.    Appearance: He is well-developed.  HENT:     Head: Normocephalic and atraumatic.     Nose:  Nose normal.     Mouth/Throat:     Mouth: Mucous membranes are moist.     Pharynx: Oropharynx is clear.  Eyes:     Extraocular Movements: Extraocular movements intact.     Conjunctiva/sclera: Conjunctivae normal.     Pupils: Pupils are equal, round, and reactive to light.  Cardiovascular:     Rate and Rhythm: Normal rate and regular rhythm.     Pulses: Normal pulses.     Heart sounds: Normal heart sounds. No murmur heard. Pulmonary:     Effort: Pulmonary effort is normal. No respiratory distress.     Breath sounds: Normal breath sounds.  Abdominal:     Palpations: Abdomen is soft.     Tenderness: There is no abdominal tenderness. There is no guarding or rebound.  Musculoskeletal:        General: No swelling.     Cervical back: Neck supple.     Right lower leg: No edema.     Left lower leg: No edema.      Comments: No spinal tenderness.  Mild tenderness over the left lower lumbar paraspinal muscles.  Negative straight leg raise.  Ambulates without difficulty.  Normal strength and sensation and reflexes in distal extremities.  Skin:    General: Skin is warm and dry.     Capillary Refill: Capillary refill takes less than 2 seconds.  Neurological:     Mental Status: He is alert.  Psychiatric:        Mood and Affect: Mood normal.     ED Results / Procedures / Treatments   Labs (all labs ordered are listed, but only abnormal results are displayed) Labs Reviewed  BASIC METABOLIC PANEL - Abnormal; Notable for the following components:      Result Value   Sodium 132 (*)    Calcium 8.7 (*)    All other components within normal limits  CBC  URINALYSIS, ROUTINE W REFLEX MICROSCOPIC    EKG None  Radiology CT Renal Stone Study Result Date: 09/03/2023 CLINICAL DATA:  Sharp left lower back pain since yesterday EXAM: CT ABDOMEN AND PELVIS WITHOUT CONTRAST TECHNIQUE: Multidetector CT imaging of the abdomen and pelvis was performed following the standard protocol without IV contrast. Unenhanced CT was performed per clinician order. Lack of IV contrast limits sensitivity and specificity, especially for evaluation of abdominal/pelvic solid viscera. RADIATION DOSE REDUCTION: This exam was performed according to the departmental dose-optimization program which includes automated exposure control, adjustment of the mA and/or kV according to patient size and/or use of iterative reconstruction technique. COMPARISON:  None Available. FINDINGS: Lower chest: No acute pleural or parenchymal lung disease. Hepatobiliary: Unremarkable unenhanced appearance of the liver and gallbladder. Pancreas: Unremarkable unenhanced appearance. Spleen: Unremarkable unenhanced appearance. Adrenals/Urinary Tract: No urinary tract calculi or obstructive uropathy. The adrenals and bladder are unremarkable. Stomach/Bowel: No bowel  obstruction or ileus. Normal appendix right lower quadrant. No bowel wall thickening or inflammatory change. Moderate retained stool throughout the colon. Vascular/Lymphatic: No significant vascular findings are present. No enlarged abdominal or pelvic lymph nodes. Reproductive: Prostate is unremarkable. Other: No free fluid or free intraperitoneal gas. No abdominal wall hernia. Musculoskeletal: No acute or destructive bony abnormalities. Reconstructed images demonstrate no additional findings. IMPRESSION: 1. No acute intra-abdominal or intrapelvic process. No urinary tract calculi. 2. Moderate retained stool throughout the colon consistent with constipation. No obstruction or ileus. Electronically Signed   By: Sharlet Salina M.D.   On: 09/03/2023 09:09    Procedures Procedures  Medications Ordered in ED Medications  ketorolac (TORADOL) 15 MG/ML injection 15 mg (15 mg Intramuscular Given 09/03/23 4098)    ED Course/ Medical Decision Making/ A&P                                 Medical Decision Making Amount and/or Complexity of Data Reviewed Labs: ordered. Radiology: ordered.  Risk Prescription drug management.   Medical Decision Making:   EDEM THOMASSON is a 52 y.o. male who presented to the ED today with left lower back pain.  Patient is he medically stable.  He is well-appearing, reports pain starting yesterday while he is on the car.  Initially cramping in sensation.  Seems more likely musculoskeletal, no sciatica, no signs or symptoms of cauda equina or epidural compression.  Will obtain CT stone study rule out renal stone.   Patient placed on continuous vitals and telemetry monitoring while in ED which was reviewed periodically.  Reviewed and confirmed nursing documentation for past medical history, family history, social history.  Reassessment and Plan:   CT scan without stone, notable for mild constipation.  I suspect his pain is likely musculoskeletal.  Given prescription for  muscle relaxers.  Recommend PCP follow-up.  Return precautions given.   Patient's presentation is most consistent with acute complicated illness / injury requiring diagnostic workup.           Final Clinical Impression(s) / ED Diagnoses Final diagnoses:  Acute left-sided low back pain without sciatica    Rx / DC Orders ED Discharge Orders          Ordered    methocarbamol (ROBAXIN) 500 MG tablet  3 times daily PRN        09/03/23 0956              Laurence Spates, MD 09/03/23 [phone]

## 2023-09-03 NOTE — ED Triage Notes (Signed)
Pt reports sharp pain in L lower back since yesterday afternoon. Denies known injury. States pain is worse when bending over or sitting. Denies urinary symptoms.

## 2023-09-03 NOTE — Discharge Instructions (Signed)
Follow up with your primary care doctor.  If you develop severe pain, abdominal pain, fever, weakness, or numbness you should return to the ED.

## 2023-09-07 ENCOUNTER — Other Ambulatory Visit: Payer: Self-pay | Admitting: Family Medicine

## 2023-09-07 DIAGNOSIS — K219 Gastro-esophageal reflux disease without esophagitis: Secondary | ICD-10-CM

## 2023-09-30 ENCOUNTER — Ambulatory Visit: Payer: Medicaid Other | Admitting: Nurse Practitioner

## 2023-09-30 ENCOUNTER — Other Ambulatory Visit: Payer: Medicaid Other

## 2023-09-30 ENCOUNTER — Encounter: Payer: Self-pay | Admitting: Nurse Practitioner

## 2023-09-30 VITALS — BP 102/60 | HR 98 | Ht 74.0 in | Wt 133.0 lb

## 2023-09-30 DIAGNOSIS — K219 Gastro-esophageal reflux disease without esophagitis: Secondary | ICD-10-CM

## 2023-09-30 DIAGNOSIS — Z8619 Personal history of other infectious and parasitic diseases: Secondary | ICD-10-CM | POA: Diagnosis not present

## 2023-09-30 DIAGNOSIS — R1013 Epigastric pain: Secondary | ICD-10-CM

## 2023-09-30 LAB — COMPREHENSIVE METABOLIC PANEL
ALT: 22 U/L (ref 0–53)
AST: 26 U/L (ref 0–37)
Albumin: 4.3 g/dL (ref 3.5–5.2)
Alkaline Phosphatase: 56 U/L (ref 39–117)
BUN: 13 mg/dL (ref 6–23)
CO2: 29 meq/L (ref 19–32)
Calcium: 9 mg/dL (ref 8.4–10.5)
Chloride: 99 meq/L (ref 96–112)
Creatinine, Ser: 0.88 mg/dL (ref 0.40–1.50)
GFR: 99.62 mL/min (ref >60.00)
Glucose, Bld: 85 mg/dL (ref 70–99)
Potassium: 4.3 meq/L (ref 3.5–5.1)
Sodium: 134 meq/L — ABNORMAL LOW (ref 135–145)
Total Bilirubin: 0.5 mg/dL (ref 0.2–1.2)
Total Protein: 7.3 g/dL (ref 6.0–8.3)

## 2023-09-30 LAB — CBC WITH DIFFERENTIAL/PLATELET
Basophils Absolute: 0 10*3/uL (ref 0.0–0.1)
Basophils Relative: 0.6 % (ref 0.0–3.0)
Eosinophils Absolute: 0 10*3/uL (ref 0.0–0.7)
Eosinophils Relative: 0.9 % (ref 0.0–5.0)
HCT: 40.6 % (ref 39.0–52.0)
Hemoglobin: 13.7 g/dL (ref 13.0–17.0)
Lymphocytes Relative: 45.2 % (ref 12.0–46.0)
Lymphs Abs: 1.9 10*3/uL (ref 0.7–4.0)
MCHC: 33.8 g/dL (ref 30.0–36.0)
MCV: 89.4 fL (ref 78.0–100.0)
Monocytes Absolute: 0.3 10*3/uL (ref 0.1–1.0)
Monocytes Relative: 7 % (ref 3.0–12.0)
Neutro Abs: 1.9 10*3/uL (ref 1.4–7.7)
Neutrophils Relative %: 46.3 % (ref 43.0–77.0)
Platelets: 261 10*3/uL (ref 150.0–400.0)
RBC: 4.54 Mil/uL (ref 4.22–5.81)
RDW: 13.4 % (ref 11.5–15.5)
WBC: 4.1 10*3/uL (ref 4.0–10.5)

## 2023-09-30 NOTE — Progress Notes (Signed)
09/30/2023 Juan Palmer [id number] 1970/12/08   CHIEF COMPLAINT: Upper abdominal discomfort   HISTORY OF PRESENT ILLNESS: Juan Palmer is a 53 year old male with a past medical history of chronic hepatitis B and H. Pylori infection. He presents to our office today as referred by Dr. Terisa Starr for further evaluation regarding epigastric pain. He endorses having epigastric "discomfort not pain" associated with increased gas per the rectum. He had daily heartburn about 3 months ago and he was prescribed Pantoprazole 40mg  every day which he took for 1-1/2 months without significant improvement. His PCP then prescribed Sucralfate 1gm po qid and his heartburn and epigastric discomfort improved but did not abate. No dysphagia. Prior H. Pylori stool antigen test was positive 12/17/2021 which was treated with Pylera x 14 days and post treatment H. Pylori breath test 03/03/2022 was negative. H. Pylori breat test 08/25/2022 was also negative. He underwent an EGD 20+ years ago in [place] which he believes was normal. He typically passes a normal brown formed stool daily. No rectal bleeding or black stools. He underwent [place] in [place] which he reported was normal. No known family history of esophageal, gastric or colorectal cancer.   He has chronic hepatitis B, never required treatment. He is originally from [place], [place]. No known family history of liver disease or live cancer. He was seen by infectious disease specialist Dr. Odette Fraction 11/26/2021. See work up as summarized below. Dr. Elinor Parkinson recommended a 6 month follow up which was not done.   Labs 11/25/2021: Hep B surface antigen reactive. Hep B DNA 31.  Labs 11/26/2021: Liver Fibro Test:F0: No fibrosis. Necroinflammatory score: No activity. Labs 05/05/2021: Hep Be antigen nonreactive. Hep Be antibody reactive. Hep D antibody negative. Hep B DNA 1,390. Hep B core IgM nonreactive. Hep A total antibody  reactive. Hep C antibody < 0.1.  INR 1.0.  Abdominal sonogram with elastography 11/19/2021:  FINDINGS: ULTRASOUND ABDOMEN   Gallbladder: No gallstones or wall thickening visualized. No sonographic Murphy sign noted by sonographer.   Common bile duct: Diameter: 4 mm   Liver: No focal lesion identified. Within normal limits in parenchymal echogenicity. Portal vein is patent on color Doppler imaging with normal direction of blood flow towards the liver.   IVC: No abnormality visualized.   Pancreas: Visualized portion unremarkable.   Spleen: Size and appearance within normal limits.   Right Kidney: Length: 10.5 cm. Echogenicity within normal limits. No mass or hydronephrosis visualized.   Left Kidney: Length: 10.5 cm. Echogenicity within normal limits. No mass or hydronephrosis visualized.   Abdominal aorta: No aneurysm visualized.   Other findings: None.   ULTRASOUND HEPATIC ELASTOGRAPHY   Device: Siemens Helix VTQ   Patient position: Supine   Transducer 5C1   Number of measurements: 10   Hepatic segment:  8   Median kPa: 4.0   IQR: 0.4   IQR/Median kPa ratio: 0.1   Data quality:  Good   Diagnostic category:  < or = 5 kPa: high probability of being normal   The use of hepatic elastography is applicable to patients with viral hepatitis and non-alcoholic fatty liver disease. At this time, there is insufficient data for the referenced cut-off values and use in other causes of liver disease, including alcoholic liver disease. Patients, however, may be assessed by elastography and serve as their own reference standard/baseline.   In patients with non-alcoholic liver disease, the values suggesting compensated advanced chronic liver  disease (cACLD) may be lower, and patients may need additional testing with elasticity results of 7-9 kPa.   Please note that abnormal hepatic elasticity and shear wave velocities may also be identified in clinical settings other  than with hepatic fibrosis, such as: acute hepatitis, elevated right heart and central venous pressures including use of beta blockers, veno-occlusive disease (Budd-Chiari), infiltrative processes such as mastocytosis/amyloidosis/infiltrative tumor/lymphoma, extrahepatic cholestasis, with hyperemia in the post-prandial state, and with liver transplantation. Correlation with patient history, laboratory data, and clinical condition recommended.   Diagnostic Categories:   < or =5 kPa: high probability of being normal   < or =9 kPa: in the absence of other known clinical signs, rules out cACLD   >9 kPa and ?13 kPa: suggestive of cACLD, but needs further testing   >13 kPa: highly suggestive of cACLD   > or =17 kPa: highly suggestive of cACLD with an increased probability of clinically significant portal hypertension   IMPRESSION: ULTRASOUND ABDOMEN:   Unremarkable abdominal ultrasound. Normal grayscale appearance of the hepatic parenchyma.   ULTRASOUND HEPATIC ELASTOGRAPHY:   Median kPa:  4.0   Diagnostic category:  < or = 5 kPa: high probability of being normal    In review of Epic records, patient was seen by cardiology 2023 - 09/2022 due to having palpitations and chest pain. Last seen by Dr. Lynnette Caffey 10/13/2022 due to having chest pain. A coronary CT was ordered but was not done. Patient denies having any further chest pain or palpitations since he saw Dr. Lynnette Caffey 10/13/2022. Prior cardiac evaluation as follows:  ETT 2023: Non-diagnostic stress EKG for ischemia due to inability to achieve target heart rate (70% MPHR). No ischemic changes observed.  Excellent exercise capacity (12:00; 13.4 METS).  Normal BP response to exercise.    TTE 2023:  1. Left ventricular ejection fraction, by estimation, is 60 to 65%. The  left ventricle has normal function. The left ventricle has no regional  wall motion abnormalities. Left ventricular diastolic parameters were  normal.   2. Right  ventricular systolic function is normal. The right ventricular  size is normal. There is normal pulmonary artery systolic pressure.   3. Some restriction to posterior leafelt motion although etiology of MR  not clear 3D TEE imaging may be useful if clinically indicated to r/o  cleft given relatively young age . The mitral valve is abnormal. Mild to  moderate mitral valve regurgitation.  No evidence of mitral stenosis.   4. The aortic valve is tricuspid. Aortic valve regurgitation is not  visualized. No aortic stenosis is present.   5. The inferior vena cava is normal in size with greater than 50%  respiratory variability, suggesting right atrial pressure of 3 mmHg.    Monitor 2023: -1 run of Ventricular Tachycardia occurred lasting 10 beats with a max rate of 148 bpm (avg 134 bpm). Isolated SVEs were rare (<1.0%), SVE Couplets were rare (<1.0%), and no SVE Triplets were present. Isolated VEs were rare (<1.0%, 38), VE Couplets were rare (<1.0%, 4), and VE Triplets were rare (<1.0%, 1).  -Patient triggered events corresponded with sinus rhythm. -No atrial fibrillation, sustained ventricular tachyarrhythmias, or bradyarrhythmias were detected.   Past Medical History:  Diagnosis Date   Constipation 10/23/2022   Hepatitis A immune 11/26/2021    Social History: Past smoker, quit smoking 1998. No alcohol since 1998. No drug use.   Family History: Father has prostate cancer. No known family history of esophageal, gastric and colon cancer. Son age 80 has autism.  No Known Allergies    Outpatient Encounter Medications as of 09/30/2023  Medication Sig   sucralfate (CARAFATE) 1 g tablet TAKE 1 TABLET (1 G TOTAL) BY MOUTH 4 TIMES A DAY WITH MEALS AND AT BEDTIME   triamcinolone (KENALOG) 0.1 % paste Use as directed in the mouth or throat 2 (two) times daily as needed.   aspirin EC 81 MG tablet Take 1 tablet (81 mg total) by mouth daily. Swallow whole. (Patient not taking: Reported on 09/30/2023)    Blood Pressure KIT 1 application  by Does not apply route as needed. (Patient not taking: Reported on 09/30/2023)   cetirizine (ZYRTEC ALLERGY) 10 MG tablet Take 1 tablet (10 mg total) by mouth at bedtime.   diclofenac Sodium (VOLTAREN) 1 % GEL Apply 2 g topically 4 (four) times daily. (Patient not taking: Reported on 09/30/2023)   fluticasone (FLONASE) 50 MCG/ACT nasal spray SPRAY 2 SPRAYS INTO EACH NOSTRIL EVERY DAY (Patient not taking: Reported on 09/30/2023)   methocarbamol (ROBAXIN) 500 MG tablet Take 1 tablet (500 mg total) by mouth 3 (three) times daily as needed for muscle spasms. (Patient not taking: Reported on 09/30/2023)   nitroGLYCERIN (NITROSTAT) 0.3 MG SL tablet Place 1 tablet (0.3 mg total) under the tongue every 5 (five) minutes as needed for chest pain. (Patient not taking: Reported on 09/30/2023)   pantoprazole (PROTONIX) 40 MG tablet TAKE 1 TABLET BY MOUTH EVERY DAY (Patient not taking: Reported on 09/30/2023)   polyethylene glycol powder (GLYCOLAX/MIRALAX) 17 GM/SCOOP powder Take 17 g by mouth daily as needed. (Patient not taking: Reported on 09/30/2023)   simethicone (GAS-X) 80 MG chewable tablet Chew 1 tablet (80 mg total) by mouth every 6 (six) hours as needed for flatulence. (Patient not taking: Reported on 09/30/2023)   No facility-administered encounter medications on file as of 09/30/2023.    REVIEW OF SYSTEMS:  Gen: + Fatigue. Denies fever, sweats or chills. No weight loss.  CV: Denies chest pain, palpitations or edema. Resp: + Cough. Denies SOB.  GI: See HPI. GU: Denies urinary burning, blood in urine, increased urinary frequency or incontinence. MS: + Back pain.  Derm: Denies rash, itchiness, skin lesions or unhealing ulcers. Psych: Denies depression, anxiety, memory loss or confusion. Heme: Denies bruising, easy bleeding. Neuro:  Denies headaches, dizziness or paresthesias. Endo:  Denies any problems with DM, thyroid or adrenal function.  PHYSICAL EXAM: BP 102/60    Pulse 98   Ht 6\' 2"  (1.88 m)   Wt 133 lb (60.3 kg)   BMI 17.08 kg/m  General: thin 53 year old in no acute distress. Head: Normocephalic and atraumatic. Eyes:  Sclerae non-icteric, conjunctive pink. Ears: Normal auditory acuity. Mouth: Dentition intact. No ulcers or lesions.  Neck: Supple, no lymphadenopathy or thyromegaly.  Lungs: Clear bilaterally to auscultation without wheezes, crackles or rhonchi. Heart: Regular rate and rhythm. No murmur, rub or gallop appreciated.  Abdomen: Soft, nontender, nondistended. No masses. No hepatosplenomegaly. Normoactive bowel sounds x 4 quadrants.  Rectal: Deferred.  Musculoskeletal: Symmetrical with no gross deformities. Skin: Warm and dry. No rash or lesions on visible extremities. Extremities: No edema. Neurological: Alert oriented x 4, no focal deficits.  Psychological:  Alert and cooperative. Normal mood and affect.  ASSESSMENT AND PLAN:  53 year old male with a history of GERD and H. Pylori infection treated with Pylera with negative H. Pylori breat test post treatment who presents with active reflux and epigastric discomfort x 3 months.  -EGD benefits and risks discussed including risk  with sedation, risk of bleeding, perforation and infection  -May restart Pantoprazole 40mg  every day -Do not take Carafate 2 to 3 days prior to EGD date   Chronic hepatitis B. Hep Be antibody positive. Abdominal sonogram with elastography 11/2021 showed a normal liver without hepatoma. Median KPA : 4.0 which indicated a low probability for advanced liver disease.  -RUQ sonogram  -CBC, CMP, AFP, Hep B surface antigen, Hep B surface antibody and Hep B DNA quant.  -I will forward RUQ sono and the above lab results to ID when received.  Colon cancer screening. Colonoscopy 2021 reported as normal.  -Request colonoscopy 2021 records from [facility]  -To verify colonoscopy recall date once records received   Prior history of precordial chest pain and  palpitations. TEE 12/2021 showed LV EF 60 - 65%. ETT 12/2021, HR did not get up to threshold for a diagnostic test but cardiology noted that is was reassuring that he exercised for 12 minutes without experiencing chest pain. No further chest pain for the past year.         CC:  Vonna Drafts, MD

## 2023-09-30 NOTE — Patient Instructions (Signed)
You have been scheduled for an endoscopy. Please follow written instructions given to you at your visit today.  If you use inhalers (even only as needed), please bring them with you on the day of your procedure. ____________________________________________ Your provider has requested that you go to the basement level for lab work before leaving today. Press "B" on the elevator. The lab is located at the first door on the left as you exit the elevator.  You have been scheduled for an abdominal ultrasound at [facility] (1st floor of hospital) on 10/12/23 at 9:00 am. Please arrive 15-20 minutes prior to your appointment for registration. Make certain not to have anything to eat or drink 6 hours prior to your appointment. Should you need to reschedule your appointment, please contact radiology at [phone]. This test typically takes about 30 minutes to perform.   Ok to restart Pantoprazole 40 mg- take 1 by mouth daily 30 minutes before breakfast   Do not take Carafate for 2-3 days prior to your endoscopy. (10/03/23)  Due to recent changes in healthcare laws, you may see the results of your imaging and laboratory studies on MyChart before your provider has had a chance to review them.  We understand that in some cases there may be results that are confusing or concerning to you. Not all laboratory results come back in the same time frame and the provider may be waiting for multiple results in order to interpret others.  Please give [place] 48 hours in order for your provider to thoroughly review all the results before contacting the office for clarification of your results.   Thank you for trusting me with your gastrointestinal care!   Alcide Evener, CRNP

## 2023-10-04 ENCOUNTER — Telehealth: Payer: Self-pay

## 2023-10-04 LAB — HEPATITIS B SURFACE ANTIBODY,QUALITATIVE: Hep B S Ab: NONREACTIVE

## 2023-10-04 LAB — HEPATITIS B DNA, ULTRAQUANTITATIVE, PCR
Hepatitis B DNA: 153 [IU]/mL — ABNORMAL HIGH
Hepatitis B virus DNA: 2.18 {Log} — ABNORMAL HIGH

## 2023-10-04 LAB — AFP TUMOR MARKER: AFP-Tumor Marker: 4.5 ng/mL (ref <6.1)

## 2023-10-04 LAB — HEPATITIS B SURFACE ANTIGEN: Hepatitis B Surface Ag: REACTIVE — AB

## 2023-10-04 NOTE — Telephone Encounter (Signed)
-----   Message from Branch sent at 10/04/2023  1:28 PM EST ----- Regarding: FW: EGD 10/06/2023 Pls contact patient and let him know he should proceed with his EGD 1/22 as scheduled as verified by Dr. Myrtie Neither and Cathlyn Parsons CRNA. THX. ----- Message ----- From: Sherrilyn Rist, MD Sent: 10/04/2023  12:34 PM EST To: Arnaldo Natal, NP Subject: RE: EGD 10/06/2023                              Thanks for the note, Colleen.  Cathlyn Parsons reviewed the chart and cleared patient for the [facility].  H Danis ----- Message ----- From: Arnaldo Natal, NP Sent: 10/02/2023   2:06 PM EST To: Sherrilyn Rist, MD Subject: EGD 10/06/2023                                  Dr. Myrtie Neither, refer to office visit note 1/16. Pls review notation regarding cardiac work up one year ago, no CP since then but he never completed coronary CT as discussed by his cardiologist. Pls verify if you are ok with patient proceeding with EGD 1/22 or if you want cardiac clearance prior to EGD. Patient understood that EGD was tentatively scheduled and you would review case. THX.

## 2023-10-04 NOTE — Telephone Encounter (Signed)
Pt made aware of Dr. Myrtie Neither and Cathlyn Parsons CRNA recommendations: Pt verbalized understanding with all questions answered.

## 2023-10-06 ENCOUNTER — Encounter: Payer: Self-pay | Admitting: Gastroenterology

## 2023-10-06 ENCOUNTER — Ambulatory Visit (AMBULATORY_SURGERY_CENTER): Payer: Medicaid Other | Admitting: Gastroenterology

## 2023-10-06 VITALS — BP 87/48 | HR 3 | Temp 98.0°F | Resp 14 | Ht 74.0 in | Wt 133.0 lb

## 2023-10-06 DIAGNOSIS — R1013 Epigastric pain: Secondary | ICD-10-CM | POA: Diagnosis not present

## 2023-10-06 DIAGNOSIS — K219 Gastro-esophageal reflux disease without esophagitis: Secondary | ICD-10-CM | POA: Diagnosis not present

## 2023-10-06 DIAGNOSIS — Z8619 Personal history of other infectious and parasitic diseases: Secondary | ICD-10-CM | POA: Diagnosis not present

## 2023-10-06 DIAGNOSIS — R1084 Generalized abdominal pain: Secondary | ICD-10-CM

## 2023-10-06 DIAGNOSIS — K222 Esophageal obstruction: Secondary | ICD-10-CM

## 2023-10-06 DIAGNOSIS — R140 Abdominal distension (gaseous): Secondary | ICD-10-CM

## 2023-10-06 MED ORDER — SODIUM CHLORIDE 0.9 % IV SOLN: 500.0000 mL | Freq: Once | INTRAVENOUS | Status: DC

## 2023-10-06 NOTE — Progress Notes (Signed)
Called to room to assist during endoscopic procedure.  Patient ID and intended procedure confirmed with present staff. Received instructions for my participation in the procedure from the performing physician.  

## 2023-10-06 NOTE — Progress Notes (Signed)
No significant changes to clinical history since GI office visit on 09/30/23.  The patient is appropriate for an endoscopic procedure in the ambulatory setting.  - Amada Jupiter, MD

## 2023-10-06 NOTE — Progress Notes (Signed)
____________________________________________________________  Attending physician addendum:  Thank you for sending this case to me. I have reviewed the entire note and agree with the plan.  I discussed this patient's case with our anesthesia provider Cathlyn Parsons, CRNA on 10/03/22, he performed chart review and cleared this patient for care in the [facility].  Amada Jupiter, MD  ____________________________________________________________

## 2023-10-06 NOTE — Patient Instructions (Addendum)
- Resume previous diet. - Continue present medications. - Await pathology results. - Follow up with NP Riley Kill) will be aranged.   YOU HAD AN ENDOSCOPIC PROCEDURE TODAY AT THE [facility]:   Refer to the procedure report that was given to you for any specific questions about what was found during the examination.  If the procedure report does not answer your questions, please call your gastroenterologist to clarify.  If you requested that your care partner not be given the details of your procedure findings, then the procedure report has been included in a sealed envelope for you to review at your convenience later.  YOU SHOULD EXPECT: Some feelings of bloating in the abdomen. Passage of more gas than usual.  Walking can help get rid of the air that was put into your GI tract during the procedure and reduce the bloating. If you had a lower endoscopy (such as a colonoscopy or flexible sigmoidoscopy) you may notice spotting of blood in your stool or on the toilet paper. If you underwent a bowel prep for your procedure, you may not have a normal bowel movement for a few days.  Please Note:  You might notice some irritation and congestion in your nose or some drainage.  This is from the oxygen used during your procedure.  There is no need for concern and it should clear up in a day or so.  SYMPTOMS TO REPORT IMMEDIATELY:  Following upper endoscopy (EGD)  Vomiting of blood or coffee ground material  New chest pain or pain under the shoulder blades  Painful or persistently difficult swallowing  New shortness of breath  Fever of 100F or higher  Black, tarry-looking stools  For urgent or emergent issues, a gastroenterologist can be reached at any hour by calling [phone]. Do not use MyChart messaging for urgent concerns.    DIET:  We do recommend a small meal at first, but then you may proceed to your regular diet.  Drink plenty of fluids but you should avoid alcoholic  beverages for 24 hours.  ACTIVITY:  You should plan to take it easy for the rest of today and you should NOT DRIVE or use heavy machinery until tomorrow (because of the sedation medicines used during the test).    FOLLOW UP: Our staff will call the number listed on your records the next business day following your procedure.  We will call around 7:15- 8:00 am to check on you and address any questions or concerns that you may have regarding the information given to you following your procedure. If we do not reach you, we will leave a message.     If any biopsies were taken you will be contacted by phone or by letter within the next 1-3 weeks.  Please call us at [phone] if you have not heard about the biopsies in 3 weeks.    SIGNATURES/CONFIDENTIALITY: You and/or your care partner have signed paperwork which will be entered into your electronic medical record.  These signatures attest to the fact that that the information above on your After Visit Summary has been reviewed and is understood.  Full responsibility of the confidentiality of this discharge information lies with you and/or your care-partner.  _______________    _______________________________________________________  Food Guidelines for those with chronic digestive trouble:  Many people have difficulty digesting certain foods, causing a variety of distressing and embarrassing symptoms such as abdominal pain, bloating and gas.  These foods may need to be avoided or  consumed in small amounts.  Here are some tips that might be helpful for you.  1.   Lactose intolerance is the difficulty or complete inability to digest lactose, the natural sugar in milk and anything made from milk.  This condition is harmless, common, and can begin any time during life.  Some people can digest a modest amount of lactose while others cannot tolerate any.  Also, not all dairy products contain equal amounts of lactose.  For example, hard cheeses such  as parmesan have less lactose than soft cheeses such as cheddar.  Yogurt has less lactose than milk or cheese.  Many packaged foods (even many brands of bread) have milk, so read ingredient lists carefully.  It is difficult to test for lactose intolerance, so just try avoiding lactose as much as possible for a week and see what happens with your symptoms.  If you seem to be lactose intolerant, the best plan is to avoid it (but make sure you get calcium from another source).  The next best thing is to use lactase enzyme supplements, available over the counter everywhere.  Just know that many lactose intolerant people need to take several tablets with each serving of dairy to avoid symptoms.  Lastly, a lot of restaurant food is made with milk or butter.  Many are things you might not suspect, such as mashed potatoes, rice and pasta (cooked with butter) and "grilled" items.  If you are lactose intolerant, it never hurts to ask your server what has milk or butter.  2.   Fiber is an important part of your diet, but not all fiber is well-tolerated.  Insoluble fiber such as bran is often consumed by normal gut bacteria and converted into gas.  Soluble fiber such as oats, squash, carrots and green beans are typically tolerated better.  3.   Some types of carbohydrates can be poorly digested.  Examples include: fructose (apples, cherries, pears, raisins and other dried fruits), fructans (onions, zucchini, large amounts of wheat), sorbitol/mannitol/xylitol and sucralose/Splenda (common artificial sweeteners), and raffinose (lentils, broccoli, cabbage, asparagus, brussel sprouts, many types of beans).  Do a Programmer, multimedia for [employer] and you will find helpful information. Beano, a dietary supplement, will often help with raffinose-containing foods.  As with lactase tablets, you may need several per serving.  4.   Whenever possible, avoid processed food&meats and chemical additives.  High fructose corn syrup, a common  sweetener, may be difficult to digest.  Eggs and soy (comes from the soybean, and added to many foods now) are other common bloating/gassy foods.  5.  Regarding gluten:  gluten is a protein mainly found in wheat, but also rye and barley.  There is a condition called celiac sprue, which is an inflammatory reaction in the small intestine causing a variety of digestive symptoms.  Blood testing is highly reliable to look for this condition, and sometimes upper endoscopy with small bowel biopsies may be necessary to make the diagnosis.  Many patients who test negative for celiac sprue report improvement in their digestive symptoms when they switch to a gluten-free diet.  However, in these "non-celiac gluten sensitive" patients, the true role of gluten in their symptoms is unclear.  Reducing carbohydrates in general may decrease the gas and bloating caused when gut bacteria consume carbs. Also, some of these patients may actually be intolerant of the baker's yeast in bread products rather than the gluten.  Flatbread and other reduced yeast breads might therefore be tolerated.  There  is no specific testing available for most food intolerances, which are discovered mainly by dietary elimination.  Please do not embark on a gluten free diet unless directed by your doctor, as it is highly restrictive, and may lead to nutritional deficiencies if not carefully monitored.  Lastly, beware of internet claims offering "personalized" tests for food intolerances.  Such testing has no reliable scientific evidence to support its reliability and correlation to symptoms.    6.  The best advice is old advice, especially for those with chronic digestive trouble - try to eat "clean".  Balanced diet, avoid processed food, plenty of fruits and vegetables, cut down the sugar, minimal alcohol, avoid tobacco. Make time to care for yourself, get enough sleep, exercise when you can, reduce stress.  Your guts will thank you for it.   - Dr.  Sherlynn [facility]  ____________________________________________________________

## 2023-10-06 NOTE — Op Note (Signed)
[facility] Patient Name: Juan Palmer Procedure Date: 10/06/2023 2:31 PM MRN: [id number] Endoscopist: Sherilyn Cooter L. Myrtie Neither , MD, [id number] Age: 53 Referring MD:  Date of Birth: 06-Apr-1971 Gender: Male Account #: [id number] Procedure:                Upper GI endoscopy Indications:              Generalized abdominal pain, Abdominal bloating, gas                           > 1 year symptoms, describes pain as generall                            "unwell" feeling, bloated, gassy. Denies diarrhea,                            constipation, rectal bleeding, loss of appetite or                            weight. Colonoscopy at outside GI practice in 2021                           H pylori discovered and treated by referring                            provider. Eradication confirmed on subsequent stool                            and breath testing                           normal CBC, CMP, U/A Medicines:                Monitored Anesthesia Care Procedure:                Pre-Anesthesia Assessment:                           - Prior to the procedure, a History and Physical                            was performed, and patient medications and                            allergies were reviewed. The patient's tolerance of                            previous anesthesia was also reviewed. The risks                            and benefits of the procedure and the sedation                            options and risks were discussed with the patient.  All questions were answered, and informed consent                            was obtained. Prior Anticoagulants: The patient has                            taken no anticoagulant or antiplatelet agents. ASA                            Grade Assessment: II - A patient with mild systemic                            disease. After reviewing the risks and benefits,                            the patient was deemed in satisfactory  condition to                            undergo the procedure.                           After obtaining informed consent, the endoscope was                            passed under direct vision. Throughout the                            procedure, the patient's blood pressure, pulse, and                            oxygen saturations were monitored continuously. The                            Olympus Scope [id number] was introduced through the                            mouth, and advanced to the second part of duodenum.                            The upper GI endoscopy was accomplished without                            difficulty. The patient tolerated the procedure                            well. Scope In: Scope Out: Findings:                 A widely patent Schatzki ring was found at the                            gastroesophageal junction.                           The exam of the esophagus was  otherwise normal.                           The stomach was normal.                           The cardia and gastric fundus were normal on                            retroflexion.                           The examined duodenum was normal. Biopsies for                            histology were taken with a cold forceps for                            evaluation of celiac disease. Complications:            No immediate complications. Estimated Blood Loss:     Estimated blood loss was minimal. Impression:               - Widely patent Schatzki ring.                           - Normal stomach.                           - Normal examined duodenum. Biopsied.                           No visible findings to explain patient's symptoms. Recommendation:           - Patient has a contact number available for                            emergencies. The signs and symptoms of potential                            delayed complications were discussed with the                            patient. Return to  normal activities tomorrow.                            Written discharge instructions were provided to the                            patient.                           - Resume previous diet.                           - Continue present medications.                           -  Await pathology results.                           - Follow up with NP Riley Kill) will be aranged.                           Patient will be given some dietary guidelines today                            regarding common causes of maldigestion, bloating,                            gas. Adaleen Hulgan L. Myrtie Neither, MD 10/06/2023 2:47:52 PM This report has been signed electronically.

## 2023-10-06 NOTE — Progress Notes (Signed)
Pt sedate, gd SR's, VSS, report to RN

## 2023-10-07 ENCOUNTER — Telehealth: Payer: Self-pay | Admitting: *Deleted

## 2023-10-07 NOTE — Telephone Encounter (Signed)
  Follow up Call-     10/06/2023    2:05 PM  Call back number  Post procedure Call Back phone  # [phone]  Permission to leave phone message No     Patient questions:  Do you have a fever, pain , or abdominal swelling? No. Pain Score  0 *  Have you tolerated food without any problems? Yes.    Have you been able to return to your normal activities? Yes.    Do you have any questions about your discharge instructions: Diet   No. Medications  No. Follow up visit  No.  Do you have questions or concerns about your Care? No.  Actions: * If pain score is 4 or above: No action needed, pain <4.

## 2023-10-11 LAB — SURGICAL PATHOLOGY

## 2023-10-12 ENCOUNTER — Ambulatory Visit (HOSPITAL_COMMUNITY)
Admission: RE | Admit: 2023-10-12 | Discharge: 2023-10-12 | Disposition: A | Discharge status: Home / Self Care | Payer: Medicaid Other | Source: Ambulatory Visit | Attending: Nurse Practitioner | Admitting: Nurse Practitioner

## 2023-10-12 DIAGNOSIS — R1013 Epigastric pain: Secondary | ICD-10-CM | POA: Diagnosis present

## 2023-10-12 DIAGNOSIS — Z8619 Personal history of other infectious and parasitic diseases: Secondary | ICD-10-CM | POA: Insufficient documentation

## 2023-10-12 DIAGNOSIS — K219 Gastro-esophageal reflux disease without esophagitis: Secondary | ICD-10-CM | POA: Insufficient documentation

## 2023-10-13 ENCOUNTER — Encounter (HOSPITAL_BASED_OUTPATIENT_CLINIC_OR_DEPARTMENT_OTHER): Payer: Self-pay | Admitting: Emergency Medicine

## 2023-10-13 ENCOUNTER — Emergency Department (HOSPITAL_BASED_OUTPATIENT_CLINIC_OR_DEPARTMENT_OTHER)
Admission: EM | Admit: 2023-10-13 | Discharge: 2023-10-13 | Disposition: A | Discharge status: Home / Self Care | Payer: Medicaid Other | Attending: Emergency Medicine | Admitting: Emergency Medicine

## 2023-10-13 ENCOUNTER — Other Ambulatory Visit: Payer: Self-pay

## 2023-10-13 ENCOUNTER — Emergency Department (HOSPITAL_BASED_OUTPATIENT_CLINIC_OR_DEPARTMENT_OTHER): Payer: Medicaid Other

## 2023-10-13 DIAGNOSIS — R059 Cough, unspecified: Secondary | ICD-10-CM | POA: Diagnosis present

## 2023-10-13 DIAGNOSIS — Z20822 Contact with and (suspected) exposure to covid-19: Secondary | ICD-10-CM | POA: Insufficient documentation

## 2023-10-13 DIAGNOSIS — J069 Acute upper respiratory infection, unspecified: Secondary | ICD-10-CM | POA: Insufficient documentation

## 2023-10-13 DIAGNOSIS — Z7982 Long term (current) use of aspirin: Secondary | ICD-10-CM | POA: Insufficient documentation

## 2023-10-13 DIAGNOSIS — Z79899 Other long term (current) drug therapy: Secondary | ICD-10-CM | POA: Diagnosis not present

## 2023-10-13 LAB — GROUP A STREP BY PCR: Group A Strep by PCR: NOT DETECTED

## 2023-10-13 LAB — RESP PANEL BY RT-PCR (RSV, FLU A&B, COVID)  RVPGX2
Influenza A by PCR: NEGATIVE
Influenza B by PCR: NEGATIVE
Resp Syncytial Virus by PCR: NEGATIVE
SARS Coronavirus 2 by RT PCR: NEGATIVE

## 2023-10-13 NOTE — ED Provider Notes (Signed)
EMERGENCY DEPARTMENT AT [facility] Provider Note   CSN: [id number] Arrival date & time: 10/13/23  1027     History Chief Complaint  Patient presents with   Cough    Juan Palmer is a 53 y.o. male.  Patient past history significant for hepatitis B, GERD, prediabetes presents to the emergency department with concerns of a cough.  Reports associated symptoms such as sore throat and runny nose for 2 days.  Regarding cough, he states the cough has been present for about 3 weeks and had some notable improvement with a sore throat began several days ago.  No recent fever chills or bodyaches.  No sick contacts as far as patient is aware.  Denies any feelings of shortness of breath or chest pain.   Cough      Home Medications Prior to Admission medications   Medication Sig Start Date End Date Taking? Authorizing Provider  aspirin EC 81 MG tablet Take 1 tablet (81 mg total) by mouth daily. Swallow whole. Patient not taking: Reported on 10/06/2023 10/09/22   Littie Deeds, MD  Blood Pressure KIT 1 application  by Does not apply route as needed. Patient not taking: Reported on 09/30/2023 06/02/22   Littie Deeds, MD  cetirizine (ZYRTEC ALLERGY) 10 MG tablet Take 1 tablet (10 mg total) by mouth at bedtime. Patient not taking: Reported on 10/06/2023 02/20/22 05/21/22  Theadora Rama Scales, PA-C  diclofenac Sodium (VOLTAREN) 1 % GEL Apply 2 g topically 4 (four) times daily. Patient not taking: Reported on 09/30/2023 08/16/23   Vonna Drafts, MD  fluticasone [facility]) 50 MCG/ACT nasal spray SPRAY 2 SPRAYS INTO EACH NOSTRIL EVERY DAY Patient not taking: Reported on 10/06/2023 02/03/23   Cora Collum, DO  methocarbamol (ROBAXIN) 500 MG tablet Take 1 tablet (500 mg total) by mouth 3 (three) times daily as needed for muscle spasms. Patient not taking: Reported on 10/06/2023 09/03/23   Laurence Spates, MD  nitroGLYCERIN (NITROSTAT) 0.3 MG SL tablet Place 1 tablet (0.3 mg total) under  the tongue every 5 (five) minutes as needed for chest pain. Patient not taking: Reported on 09/30/2023 10/09/22   Littie Deeds, MD  OPZELURA 1.5 % CREA Apply 1.5 Applications topically 2 (two) times daily. 08/30/23   [provider]  pantoprazole (PROTONIX) 40 MG tablet TAKE 1 TABLET BY MOUTH EVERY DAY Patient not taking: Reported on 09/30/2023 05/07/23   Vonna Drafts, MD  polyethylene glycol powder (GLYCOLAX/MIRALAX) 17 GM/SCOOP powder Take 17 g by mouth daily as needed. Patient not taking: Reported on 10/06/2023 10/23/22   Littie Deeds, MD  simethicone (GAS-X) 80 MG chewable tablet Chew 1 tablet (80 mg total) by mouth every 6 (six) hours as needed for flatulence. Patient not taking: Reported on 10/06/2023 06/22/23   Alicia Amel, MD  sucralfate (CARAFATE) 1 g tablet TAKE 1 TABLET (1 G TOTAL) BY MOUTH 4 TIMES A DAY WITH MEALS AND AT BEDTIME 09/09/23   Vonna Drafts, MD  triamcinolone (KENALOG) 0.1 % paste Use as directed in the mouth or throat 2 (two) times daily as needed. 02/26/23   Littie Deeds, MD      Allergies    Patient has no known allergies.    Review of Systems   Review of Systems  Respiratory:  Positive for cough.   All other systems reviewed and are negative.   Physical Exam Updated Vital Signs BP (!) 101/57 (BP Location: Right Arm)   Pulse (!) 58   Temp 98.7  F (37.1 C) (Oral)   Resp 18   SpO2 100%  Physical Exam Vitals and nursing note reviewed.  Constitutional:      General: He is not in acute distress.    Appearance: He is well-developed.  HENT:     Head: Normocephalic and atraumatic.     Mouth/Throat:     Comments: No oropharyngeal erythema, tonsillar exudate, or uvular deviation seen. Eyes:     Conjunctiva/sclera: Conjunctivae normal.  Cardiovascular:     Rate and Rhythm: Normal rate and regular rhythm.     Heart sounds: No murmur heard. Pulmonary:     Effort: Pulmonary effort is normal. No respiratory distress.     Breath sounds: Normal breath  sounds.  Abdominal:     Palpations: Abdomen is soft.     Tenderness: There is no abdominal tenderness.  Musculoskeletal:        General: No swelling.     Cervical back: Neck supple.  Skin:    General: Skin is warm and dry.     Capillary Refill: Capillary refill takes less than 2 seconds.  Neurological:     Mental Status: He is alert.  Psychiatric:        Mood and Affect: Mood normal.     ED Results / Procedures / Treatments   Labs (all labs ordered are listed, but only abnormal results are displayed) Labs Reviewed  RESP PANEL BY RT-PCR (RSV, FLU A&B, COVID)  RVPGX2  GROUP A STREP BY PCR    EKG None  Radiology DG Chest 2 View Result Date: 10/13/2023 CLINICAL DATA:  Cough. EXAM: CHEST - 2 VIEW COMPARISON:  Chest radiograph dated October 20, 2007. CT chest dated June 02, 2013. FINDINGS: The heart size and mediastinal contours are within normal limits. Hyperinflation. Mild biapical scarring. No focal consolidation, pleural effusion, or pneumothorax. No acute osseous abnormality. IMPRESSION: No acute cardiopulmonary findings.  Hyperinflation. Electronically Signed   By: Hart Robinsons M.D.   On: 10/13/2023 09:56   US Abdomen Limited RUQ (LIVER/GB) Result Date: 10/12/2023 CLINICAL DATA:  Hepatitis-B EXAM: ULTRASOUND ABDOMEN LIMITED RIGHT UPPER QUADRANT COMPARISON:  Renal CT 09/03/2023 FINDINGS: Gallbladder: No gallstones or wall thickening visualized. No sonographic Murphy sign noted by sonographer. Common bile duct: Diameter: 5.2 mm Liver: No focal lesion identified. Within normal limits in parenchymal echogenicity. Portal vein is patent on color Doppler imaging with normal direction of blood flow towards the liver. Other: None. IMPRESSION: No cholelithiasis or sonographic evidence for acute cholecystitis. Electronically Signed   By: Annia Belt M.D.   On: 10/12/2023 09:03    Procedures Procedures    Medications Ordered in ED Medications - No data to display  ED Course/  Medical Decision Making/ A&P                                 Medical Decision Making Amount and/or Complexity of Data Reviewed Radiology: ordered.   This patient presents to the ED for concern of cough.  Differential diagnosis includes COVID-19, strep pharyngitis, pneumonia, viral URI   Lab Tests:  I Ordered, and personally interpreted labs.  The pertinent results include: Respiratory panel negative for COVID-19, influenza, RSV, group A strep negative   Imaging Studies ordered:  I ordered imaging studies including chest x-ray I independently visualized and interpreted imaging which showed no acute cardiopulmonary process I agree with the radiologist interpretation   Problem List / ED Course:  Patient with past  history significant for hepatitis B, prediabetes presents the emergency department concerns of a cough.  Reports that he has had a cough ongoing for about 3 weeks without significant improvement.  No prior history of chronic bronchitis.  No pulmonary conditions.  He states that he initially had some interval improvement in symptoms at about 2 weeks but had worsening particularly in the last several days with the developing sore throat.  Denies any fever, chills or bodyaches as far she is noted. Physical exam is unremarkable as patient is otherwise well-appearing but does seem fatigued.  No adventitious lung sounds.  No notable heart murmurs.  At this time differential includes strep pharyngitis, pneumonia, viral URI such as COVID-19 or influenza versus other.  Will await results for further management. Patient's workup is reassuring without any acute findings to suggest viral process detected on swab.  I suspect this still may be a viral URI given progression of symptoms.  However, in a subacute setting with a cough lasting greater than 3 weeks, other differential diagnosis can be considered such as acid reflux, postviral cough syndrome, bronchitis.  Given patient is well-appearing,  I believe that he is stable for discharge home and outpatient follow-up.  Advised strict return precautions. Discharged home in stable condition.  Final Clinical Impression(s) / ED Diagnoses Final diagnoses:  Viral URI with cough    Rx / DC Orders ED Discharge Orders     None         Smitty Knudsen, PA-C 10/13/23 1025    Terrilee Files, MD 10/13/23 1737

## 2023-10-13 NOTE — ED Triage Notes (Signed)
C/o cough, sore throat, and runny nose x 2 days. States cough has lasted for 3 weeks now. Denies fever.

## 2023-10-13 NOTE — ED Notes (Signed)

## 2023-10-13 NOTE — Discharge Instructions (Addendum)
You were seen in the ER today for concerns of a cough. You tested negative for COVID-19, influenza, and RSV as well as strep.  Your chest x-ray was negative without any findings to suggest pneumonia.  I suspect you likely have a viral upper respiratory infection that is causing your current symptoms.  You should continue to take over-the-counter medications such as cough and cold medicine.  Adding in a decongestant medication such as Mucinex may also be helpful to clear any congestion in your chest.  For any worsening in symptoms or chest pain or shortness of breath, return to the emergency department for repeat evaluation.

## 2023-10-14 ENCOUNTER — Encounter: Payer: Self-pay | Admitting: Gastroenterology

## 2023-12-25 ENCOUNTER — Encounter (HOSPITAL_BASED_OUTPATIENT_CLINIC_OR_DEPARTMENT_OTHER): Payer: Self-pay | Admitting: Emergency Medicine

## 2023-12-25 ENCOUNTER — Observation Stay (HOSPITAL_BASED_OUTPATIENT_CLINIC_OR_DEPARTMENT_OTHER)
Admission: EM | Admit: 2023-12-25 | Discharge: 2023-12-27 | Disposition: A | Discharge status: Home / Self Care | Attending: Internal Medicine | Admitting: Internal Medicine

## 2023-12-25 ENCOUNTER — Emergency Department (HOSPITAL_BASED_OUTPATIENT_CLINIC_OR_DEPARTMENT_OTHER)

## 2023-12-25 ENCOUNTER — Other Ambulatory Visit: Payer: Self-pay

## 2023-12-25 DIAGNOSIS — Z7982 Long term (current) use of aspirin: Secondary | ICD-10-CM | POA: Diagnosis not present

## 2023-12-25 DIAGNOSIS — B181 Chronic viral hepatitis B without delta-agent: Secondary | ICD-10-CM | POA: Insufficient documentation

## 2023-12-25 DIAGNOSIS — G459 Transient cerebral ischemic attack, unspecified: Secondary | ICD-10-CM | POA: Diagnosis not present

## 2023-12-25 DIAGNOSIS — Z7902 Long term (current) use of antithrombotics/antiplatelets: Secondary | ICD-10-CM | POA: Insufficient documentation

## 2023-12-25 DIAGNOSIS — Z79899 Other long term (current) drug therapy: Secondary | ICD-10-CM | POA: Insufficient documentation

## 2023-12-25 DIAGNOSIS — R200 Anesthesia of skin: Principal | ICD-10-CM

## 2023-12-25 DIAGNOSIS — K219 Gastro-esophageal reflux disease without esophagitis: Secondary | ICD-10-CM | POA: Insufficient documentation

## 2023-12-25 DIAGNOSIS — R202 Paresthesia of skin: Secondary | ICD-10-CM | POA: Diagnosis present

## 2023-12-25 DIAGNOSIS — R2990 Unspecified symptoms and signs involving the nervous system: Secondary | ICD-10-CM | POA: Diagnosis present

## 2023-12-25 LAB — APTT: aPTT: 34 s (ref 24–36)

## 2023-12-25 LAB — COMPREHENSIVE METABOLIC PANEL WITH GFR
ALT: 20 U/L (ref 0–44)
AST: 25 U/L (ref 15–41)
Albumin: 4 g/dL (ref 3.5–5.0)
Alkaline Phosphatase: 49 U/L (ref 38–126)
Anion gap: 9 (ref 5–15)
BUN: 11 mg/dL (ref 6–20)
CO2: 25 mmol/L (ref 22–32)
Calcium: 8.6 mg/dL — ABNORMAL LOW (ref 8.9–10.3)
Chloride: 96 mmol/L — ABNORMAL LOW (ref 98–111)
Creatinine, Ser: 0.8 mg/dL (ref 0.61–1.24)
GFR, Estimated: >60 mL/min (ref >60)
Glucose, Bld: 95 mg/dL (ref 70–99)
Potassium: 3.8 mmol/L (ref 3.5–5.1)
Sodium: 130 mmol/L — ABNORMAL LOW (ref 135–145)
Total Bilirubin: 0.6 mg/dL (ref 0.0–1.2)
Total Protein: 7.2 g/dL (ref 6.5–8.1)

## 2023-12-25 LAB — CBC
HCT: 38 % — ABNORMAL LOW (ref 39.0–52.0)
Hemoglobin: 13.6 g/dL (ref 13.0–17.0)
MCH: 31.1 pg (ref 26.0–34.0)
MCHC: 35.8 g/dL (ref 30.0–36.0)
MCV: 86.8 fL (ref 80.0–100.0)
Platelets: 281 10*3/uL (ref 150–400)
RBC: 4.38 MIL/uL (ref 4.22–5.81)
RDW: 12.3 % (ref 11.5–15.5)
WBC: 5.3 10*3/uL (ref 4.0–10.5)
nRBC: 0 % (ref 0.0–0.2)

## 2023-12-25 LAB — ETHANOL: Alcohol, Ethyl (B): <10 mg/dL (ref <10)

## 2023-12-25 LAB — DIFFERENTIAL
Abs Immature Granulocytes: 0.01 10*3/uL (ref 0.00–0.07)
Basophils Absolute: 0 10*3/uL (ref 0.0–0.1)
Basophils Relative: 1 %
Eosinophils Absolute: 0.1 10*3/uL (ref 0.0–0.5)
Eosinophils Relative: 1 %
Immature Granulocytes: 0 %
Lymphocytes Relative: 36 %
Lymphs Abs: 1.9 10*3/uL (ref 0.7–4.0)
Monocytes Absolute: 0.5 10*3/uL (ref 0.1–1.0)
Monocytes Relative: 9 %
Neutro Abs: 2.8 10*3/uL (ref 1.7–7.7)
Neutrophils Relative %: 53 %

## 2023-12-25 LAB — CBG MONITORING, ED: Glucose-Capillary: 81 mg/dL (ref 70–99)

## 2023-12-25 LAB — TROPONIN I (HIGH SENSITIVITY): Troponin I (High Sensitivity): 3 ng/L (ref <18)

## 2023-12-25 LAB — PROTIME-INR
INR: 1 (ref 0.8–1.2)
Prothrombin Time: 13 s (ref 11.4–15.2)

## 2023-12-25 MED ORDER — SODIUM CHLORIDE 0.9 % IV SOLN: INTRAVENOUS | Status: AC | PRN

## 2023-12-25 NOTE — Progress Notes (Addendum)
 2112 arrival by POV 2116 seen by EDP/PA 2139 elert for code activation  LKW 1815 with complaints of sensation deficits to left pinky progressing to left arm, left face and left leg.  2144 down to scan 2151 page to TS 2152 back from scan 2203 NCCT read negative 2204 Dr. Clois Dandy on screen 2213 read relayed to Dr. Clois Dandy  No tnk, non disabling

## 2023-12-25 NOTE — Consult Note (Signed)
 [facility] Consult [facility]   Patient Name:   Juan Palmer, Juan Palmer Date of Birth:   May 16, 1971 Identification Number:   MRN - [id number] Date of Service:   12/25/2023 21:51:01  Diagnosis:       R20.2 - Paresthesia of skin  Impression:      53 year old male who presents to the hospital because of left side paresthesia. Presentation concerning for possible small vessel stroke.  Our recommendations are outlined below.  Recommendations:        Stroke/Telemetry Floor       Neuro Checks       Bedside Swallow Eval       DVT Prophylaxis       IV Fluids, Normal Saline       Head of Bed 30 Degrees       Euglycemia and Avoid Hyperthermia (PRN Acetaminophen)       Initiate or continue Aspirin 81 MG daily  Sign Out:       Discussed with Emergency Department Provider    ------------------------------------------------------------------------------  Advanced Imaging: Advanced Imaging Deferred because:  Non-disabling symptoms as verified by the patient; no cortical signs so not consistent with LVO   Metrics: Last Known Well: 12/25/2023 18:15:00 Dispatch Time: 12/25/2023 21:51:01 Arrival Time: 12/25/2023 21:12:00 Initial Response Time: 12/25/2023 22:04:26 Symptoms: Left side tingling. Initial patient interaction: 12/25/2023 22:15:02 NIHSS Assessment Completed: 12/25/2023 22:15:15 Patient is not a candidate for Thrombolytic. Thrombolytic Medical Decision: 12/25/2023 22:15:17 Patient was not deemed candidate for Thrombolytic because of following reasons: Stroke severity too mild (non-disabling) .  CT head showed no acute hemorrhage or acute core infarct.  Primary Provider Notified of Diagnostic Impression and Management Plan on: 12/25/2023 22:18:13    ------------------------------------------------------------------------------  History of Present Illness: Patient is a 53 year old Male.  Patient was brought by private transportation with  symptoms of Left side tingling. 53 year old male with a history of hepatitis B and GERD who presents to the hospital because of tingling on the left side. Patient noticed tingling in the left pinky and then he felt it spread to his left leg and face. On exam patient had normal strength and speech. There was no facial droop.   Past Medical History:      There is no history of Hypertension      There is no history of Diabetes Mellitus      There is no history of Hyperlipidemia      There is no history of Atrial Fibrillation  Medications:  No Anticoagulant use  No Antiplatelet use Reviewed EMR for current medications  Allergies:  Reviewed  Social History: Drug Use: No  Family History:  There is no family history of premature cerebrovascular disease pertinent to this consultation  ROS : 14 Points Review of Systems was performed and was negative except mentioned in HPI.  Past Surgical History: There Is No Surgical History Contributory To Today's Visit    Examination: BP(140/81), Pulse(55), Blood Glucose(81) 1A: Level of Consciousness - Alert; keenly responsive + 0 1B: Ask Month and Age - Both Questions Right + 0 1C: Blink Eyes & Squeeze Hands - Performs Both Tasks + 0 2: Test Horizontal Extraocular Movements - Normal + 0 3: Test Visual Fields - No Visual Loss + 0 4: Test Facial Palsy (Use Grimace if Obtunded) - Normal symmetry + 0 5A: Test Left Arm Motor Drift - No Drift for 10 Seconds + 0 5B: Test Right Arm Motor Drift - No Drift for 10 Seconds +  0 6A: Test Left Leg Motor Drift - No Drift for 5 Seconds + 0 6B: Test Right Leg Motor Drift - No Drift for 5 Seconds + 0 7: Test Limb Ataxia (FNF/Heel-Shin) - No Ataxia + 0 8: Test Sensation - Mild-Moderate Loss: Less Sharp/More Dull + 1 9: Test Language/Aphasia - Normal; No aphasia + 0 10: Test Dysarthria - Normal + 0 11: Test Extinction/Inattention - No abnormality + 0  NIHSS Score: 1   Pre-Morbid Modified Rankin  Scale: 0 Points = No symptoms at all  Spoke with : Dr. Tamela Fake  This consult was conducted in real time using interactive audio and Immunologist. Patient was informed of the technology being used for this visit and agreed to proceed. Patient located in hospital and provider located at home/office setting.   Patient is being evaluated for possible acute neurologic impairment and high probability of imminent or life-threatening deterioration. I spent total of 30 minutes providing care to this patient, including time for face to face visit via telemedicine, review of medical records, imaging studies and discussion of findings with providers, the patient and/or family.   Dr Leandra Pro   [facility] For Inpatient follow-up with [facility] physician please call [facility] at [phone]. As we are not an outpatient service for any post hospital discharge needs please contact the hospital for assistance. If you have any questions for the [facility] physicians or need to reconsult for clinical or diagnostic changes please contact us  via [facility] at [phone].

## 2023-12-25 NOTE — ED Triage Notes (Addendum)
 L sided arm numbness that starts at his 5th digit. Pt denies changes in sensation to face, and states "its starting to go down my leg" while motioning at his hip. Pt is able to speak clearly, no noted facial droop. Pt was ambulatory without assistance to tx room. EDP notified. PA at bedside during triage. Pt states this started at 1800. He also mentions it happened "a few days ago" but resolved without intervention. Per PA no code stroke at this time.

## 2023-12-25 NOTE — ED Provider Notes (Signed)
 [place] [facility] Provider Note   CSN: [id number] Arrival date & time: 12/25/23  2112     History  Chief Complaint  Patient presents with   Numbness    Juan Palmer is a 53 y.o. male past medical history significant for chronic hepatitis, GERD, vitiligo, H. pylori who presents concern for some left-sided arm numbness that starts at fifth digit.  He did denies any weakness.  He reports that the sensory changes seem to be happening on the whole left side of the body now.  Symptoms started around 6 PM.  He mentioned that it happened a few days ago but resolved without intervention.  HPI     Home Medications Prior to Admission medications   Medication Sig Start Date End Date Taking? Authorizing Provider  aspirin EC 81 MG tablet Take 1 tablet (81 mg total) by mouth daily. Swallow whole. Patient not taking: Reported on 10/06/2023 10/09/22   Joelle Musca, MD  Blood Pressure KIT 1 application  by Does not apply route as needed. Patient not taking: Reported on 09/30/2023 06/02/22   Joelle Musca, MD  cetirizine (ZYRTEC ALLERGY) 10 MG tablet Take 1 tablet (10 mg total) by mouth at bedtime. Patient not taking: Reported on 10/06/2023 02/20/22 05/21/22  Eloise Hake Scales, PA-C  diclofenac Sodium (VOLTAREN) 1 % GEL Apply 2 g topically 4 (four) times daily. Patient not taking: Reported on 09/30/2023 08/16/23   Edison Gore, MD  fluticasone (FLONASE) 50 MCG/ACT nasal spray SPRAY 2 SPRAYS INTO EACH NOSTRIL EVERY DAY Patient not taking: Reported on 10/06/2023 02/03/23   Paige, Victoria J, DO  methocarbamol (ROBAXIN) 500 MG tablet Take 1 tablet (500 mg total) by mouth 3 (three) times daily as needed for muscle spasms. Patient not taking: Reported on 10/06/2023 09/03/23   Davis, Jonathon H, MD  nitroGLYCERIN (NITROSTAT) 0.3 MG SL tablet Place 1 tablet (0.3 mg total) under the tongue every 5 (five) minutes as needed for chest pain. Patient not taking: Reported on  09/30/2023 10/09/22   Joelle Musca, MD  OPZELURA 1.5 % CREA Apply 1.5 Applications topically 2 (two) times daily. 08/30/23   [provider]  pantoprazole (PROTONIX) 40 MG tablet TAKE 1 TABLET BY MOUTH EVERY DAY Patient not taking: Reported on 09/30/2023 05/07/23   Edison Gore, MD  polyethylene glycol powder (GLYCOLAX/MIRALAX) 17 GM/SCOOP powder Take 17 g by mouth daily as needed. Patient not taking: Reported on 10/06/2023 10/23/22   Joelle Musca, MD  simethicone (GAS-X) 80 MG chewable tablet Chew 1 tablet (80 mg total) by mouth every 6 (six) hours as needed for flatulence. Patient not taking: Reported on 10/06/2023 06/22/23   Limmie Ren, MD  sucralfate (CARAFATE) 1 g tablet TAKE 1 TABLET (1 G TOTAL) BY MOUTH 4 TIMES A DAY WITH MEALS AND AT BEDTIME 09/09/23   Edison Gore, MD  triamcinolone (KENALOG) 0.1 % paste Use as directed in the mouth or throat 2 (two) times daily as needed. 02/26/23   Joelle Musca, MD      Allergies    Patient has no known allergies.    Review of Systems   Review of Systems  All other systems reviewed and are negative.   Physical Exam Updated Vital Signs BP 118/82   Pulse (!) 52   Temp 97.6 F (36.4 C) (Oral)   Resp 15   Ht 6\' 2"  (1.88 m)   Wt 58.3 kg   SpO2 100%   BMI 16.51 kg/m  Physical Exam  Vitals and nursing note reviewed.  Constitutional:      General: He is not in acute distress.    Appearance: Normal appearance.  HENT:     Head: Normocephalic and atraumatic.  Eyes:     General:        Right eye: No discharge.        Left eye: No discharge.  Cardiovascular:     Rate and Rhythm: Normal rate and regular rhythm.     Heart sounds: No murmur heard.    No friction rub. No gallop.  Pulmonary:     Effort: Pulmonary effort is normal.     Breath sounds: Normal breath sounds.  Abdominal:     General: Bowel sounds are normal.     Palpations: Abdomen is soft.  Skin:    General: Skin is warm and dry.     Capillary Refill: Capillary  refill takes less than 2 seconds.  Neurological:     Mental Status: He is alert and oriented to person, place, and time.     Comments: Cranial nerves II through XII grossly intact.  Intact finger-nose, intact heel-to-shin.  Romberg negative, gait normal.  Alert and oriented x3.  Moves all 4 limbs spontaneously, normal coordination.  No pronator drift.  Intact strength 5 out of 5 bilateral upper and lower extremities.  He endorses some subjective sensory deficits of the left side of the face, left arm, left side of the abdomen and left leg.  He reports a slight difference in sensation but not overt numbness.  Psychiatric:        Mood and Affect: Mood normal.        Behavior: Behavior normal.     ED Results / Procedures / Treatments   Labs (all labs ordered are listed, but only abnormal results are displayed) Labs Reviewed  CBC - Abnormal; Notable for the following components:      Result Value   HCT 38.0 (*)    All other components within normal limits  COMPREHENSIVE METABOLIC PANEL WITH GFR - Abnormal; Notable for the following components:   Sodium 130 (*)    Chloride 96 (*)    Calcium 8.6 (*)    All other components within normal limits  ETHANOL  PROTIME-INR  APTT  DIFFERENTIAL  RAPID URINE DRUG SCREEN, HOSP PERFORMED  URINALYSIS, ROUTINE W REFLEX MICROSCOPIC  PREGNANCY, URINE  CBG MONITORING, ED  TROPONIN I (HIGH SENSITIVITY)  TROPONIN I (HIGH SENSITIVITY)    EKG None  Radiology CT HEAD CODE STROKE WO CONTRAST` Result Date: 12/25/2023 CLINICAL DATA:  Code stroke. Initial evaluation for acute neuro deficit, stroke suspected. EXAM: CT HEAD WITHOUT CONTRAST TECHNIQUE: Contiguous axial images were obtained from the base of the skull through the vertex without intravenous contrast. RADIATION DOSE REDUCTION: This exam was performed according to the departmental dose-optimization program which includes automated exposure control, adjustment of the mA and/or kV according to  patient size and/or use of iterative reconstruction technique. COMPARISON:  None Available. FINDINGS: Brain: Cerebral volume within normal limits for patient age. No acute intracranial hemorrhage. No acute large vessel territory infarct. No mass lesion, midline shift, or mass effect. Ventricles are normal in size without hydrocephalus. No extra-axial fluid collection. Vascular: No abnormal hyperdense vessel. Skull: Scalp soft tissues demonstrate no acute abnormality. Calvarium intact. Sinuses/Orbits: Globes and orbital soft tissues within normal limits. Visualized paranasal sinuses are largely clear. No significant mastoid effusion. ASPECTS [facility] Stroke Program Early CT Score) - Ganglionic level infarction (caudate, lentiform nuclei, internal  capsule, insula, M1-M3 cortex): 7 - Supraganglionic infarction (M4-M6 cortex): 3 Total score (0-10 with 10 being normal): 10 IMPRESSION: 1. Normal head CT.  No acute intracranial abnormality. 2. ASPECTS is 10. Results were called by telephone at the time of interpretation on 12/25/2023 at 10:03 pm to provider [facility] , who verbally acknowledged these results. Electronically Signed   By: Virgia Griffins M.D.   On: 12/25/2023 22:03    Procedures Procedures    Medications Ordered in ED Medications  0.9 %  sodium chloride infusion (has no administration in time range)    ED Course/ Medical Decision Making/ A&P                                 Medical Decision Making Amount and/or Complexity of Data Reviewed Labs: ordered. Radiology: ordered.   This patient is a 53 y.o. male who presents to the ED for concern of unilateral numbness, this involves an extensive number of treatment options, and is a complaint that carries with it a high risk of complications and morbidity. The emergent differential diagnosis prior to evaluation includes, but is not limited to,  stroke, MS, complex migraine, ALS, guillaine barre, vs cervical radiculopathy, new  spinal cord injury, vs other . This is not an exhaustive differential.   Past Medical History / Co-morbidities / Social History: Eczema, hepatitis B, vitiligo  Additional history: Chart reviewed. Pertinent results include: Reviewed lab work, imaging from previous emergency department visits, remote EKG  Physical Exam: Physical exam performed. The pertinent findings include: Cranial nerves II through XII grossly intact.  Intact finger-nose, intact heel-to-shin.  Romberg negative, gait normal.  Alert and oriented x3.  Moves all 4 limbs spontaneously, normal coordination.  No pronator drift.  Intact strength 5 out of 5 bilateral upper and lower extremities.  He endorses some subjective sensory deficits of the left side of the face, left arm, left side of the abdomen and left leg.  He reports a slight difference in sensation but not overt numbness.   Vital signs stable in the emergency department other than mild bradycardic heart rate.  Lab Tests and imaging: I ordered, and personally interpreted labs.  The pertinent results include: Mild hyponatremia, sodium 130, otherwise unremarkable metabolic panel, CBC unremarkable, differential unremarkable, normal troponin in context of some atypical left-sided symptoms without chest pain.  I independently interpreted CT head without contrast which showed no evidence of acute stroke.  I agree with the radiologist interpretation  Cardiac Monitoring:  The patient was maintained on a cardiac monitor.  My attending physician Dr. Tamela Fake viewed and interpreted the cardiac monitored which showed an underlying rhythm of: Sinus rhythm, some diffuse ST segment elevation suspicious for pericarditis, not very significantly changed from. I agree with this interpretation.  Consultations Obtained: I requested consultation with the teleneurologist,  Vajapey and discussed lab and imaging findings as well as pertinent plan - they recommend: Overnight admission for further  workup, we discussed transfer for MR, but they reported that if there were stroke it may be too early to see and they would recommend overnight admission at this time regardless.  Spoke to the hospitalist, Dr. [facility] Lavender who agrees to admission for stroke workup.   Disposition: After consideration of the diagnostic results and the patients response to treatment, I feel that patient would benefit from hospital admission.   I discussed this case with my attending physician Dr. Tamela Fake who cosigned this note including  patient's presenting symptoms, physical exam, and planned diagnostics and interventions. Attending physician stated agreement with plan or made changes to plan which were implemented.    Final Clinical Impression(s) / ED Diagnoses Final diagnoses:  None    Rx / DC Orders ED Discharge Orders     None         Stefan Edge 12/25/23 2300    Scarlette Currier, MD 12/28/23 1329

## 2023-12-26 ENCOUNTER — Observation Stay (HOSPITAL_COMMUNITY)

## 2023-12-26 ENCOUNTER — Encounter (HOSPITAL_COMMUNITY): Payer: Self-pay | Admitting: Internal Medicine

## 2023-12-26 DIAGNOSIS — K219 Gastro-esophageal reflux disease without esophagitis: Secondary | ICD-10-CM | POA: Diagnosis not present

## 2023-12-26 DIAGNOSIS — R2990 Unspecified symptoms and signs involving the nervous system: Secondary | ICD-10-CM

## 2023-12-26 DIAGNOSIS — R202 Paresthesia of skin: Secondary | ICD-10-CM | POA: Diagnosis present

## 2023-12-26 DIAGNOSIS — Z7902 Long term (current) use of antithrombotics/antiplatelets: Secondary | ICD-10-CM | POA: Diagnosis not present

## 2023-12-26 DIAGNOSIS — R200 Anesthesia of skin: Secondary | ICD-10-CM

## 2023-12-26 DIAGNOSIS — Z79899 Other long term (current) drug therapy: Secondary | ICD-10-CM | POA: Diagnosis not present

## 2023-12-26 DIAGNOSIS — Z7982 Long term (current) use of aspirin: Secondary | ICD-10-CM | POA: Diagnosis not present

## 2023-12-26 DIAGNOSIS — B181 Chronic viral hepatitis B without delta-agent: Secondary | ICD-10-CM | POA: Diagnosis not present

## 2023-12-26 DIAGNOSIS — G459 Transient cerebral ischemic attack, unspecified: Secondary | ICD-10-CM

## 2023-12-26 LAB — RAPID URINE DRUG SCREEN, HOSP PERFORMED
Amphetamines: NOT DETECTED
Barbiturates: NOT DETECTED
Benzodiazepines: NOT DETECTED
Cocaine: NOT DETECTED
Opiates: NOT DETECTED
Tetrahydrocannabinol: NOT DETECTED

## 2023-12-26 LAB — URINALYSIS, ROUTINE W REFLEX MICROSCOPIC
Bilirubin Urine: NEGATIVE
Glucose, UA: NEGATIVE mg/dL
Hgb urine dipstick: NEGATIVE
Ketones, ur: NEGATIVE mg/dL
Leukocytes,Ua: NEGATIVE
Nitrite: NEGATIVE
Protein, ur: NEGATIVE mg/dL
Specific Gravity, Urine: 1.015 (ref 1.005–1.030)
pH: 7 (ref 5.0–8.0)

## 2023-12-26 LAB — PREGNANCY, URINE: Preg Test, Ur: NEGATIVE

## 2023-12-26 LAB — HEMOGLOBIN A1C
Hgb A1c MFr Bld: 5.2 % (ref 4.8–5.6)
Mean Plasma Glucose: 102.54 mg/dL

## 2023-12-26 LAB — ECHOCARDIOGRAM COMPLETE
AR max vel: 2.77 cm2
AV Peak grad: 3.6 mmHg
Ao pk vel: 0.95 m/s
Area-P 1/2: 3.37 cm2
Height: 74 in
MV M vel: 3.89 m/s
MV Peak grad: 60.5 mmHg
S' Lateral: 2.3 cm
Weight: 2057.6 [oz_av]

## 2023-12-26 LAB — TROPONIN I (HIGH SENSITIVITY): Troponin I (High Sensitivity): 3 ng/L (ref <18)

## 2023-12-26 LAB — HIV ANTIBODY (ROUTINE TESTING W REFLEX): HIV Screen 4th Generation wRfx: NONREACTIVE

## 2023-12-26 MED ORDER — PANTOPRAZOLE SODIUM 40 MG PO TBEC
40.0000 mg | DELAYED_RELEASE_TABLET | Freq: Every day | ORAL | Status: DC
  Administered 2023-12-26 – 2023-12-27 (×2): 40 mg via ORAL
  Filled 2023-12-26 (×2): qty 1

## 2023-12-26 MED ORDER — ACETAMINOPHEN 325 MG PO TABS: 650.0000 mg | ORAL_TABLET | Freq: Four times a day (QID) | ORAL | Status: DC | PRN

## 2023-12-26 MED ORDER — ACETAMINOPHEN 650 MG RE SUPP: 650.0000 mg | Freq: Four times a day (QID) | RECTAL | Status: DC | PRN

## 2023-12-26 MED ORDER — ENOXAPARIN SODIUM 40 MG/0.4ML IJ SOSY
40.0000 mg | PREFILLED_SYRINGE | INTRAMUSCULAR | Status: DC
  Administered 2023-12-26: 40 mg via SUBCUTANEOUS
  Filled 2023-12-26: qty 0.4

## 2023-12-26 MED ORDER — ASPIRIN 81 MG PO TBEC
81.0000 mg | DELAYED_RELEASE_TABLET | Freq: Every day | ORAL | Status: DC
  Administered 2023-12-26 – 2023-12-27 (×2): 81 mg via ORAL
  Filled 2023-12-26 (×2): qty 1

## 2023-12-26 MED ORDER — STROKE: EARLY STAGES OF RECOVERY BOOK
Freq: Once | Status: AC
  Filled 2023-12-26: qty 1

## 2023-12-26 MED ORDER — IOHEXOL 350 MG/ML SOLN
75.0000 mL | Freq: Once | INTRAVENOUS | Status: AC | PRN
  Administered 2023-12-26: 75 mL via INTRAVENOUS

## 2023-12-26 NOTE — ED Notes (Addendum)
 Patient presents with Left side weakness. Pt able to clearly inform staff at bedside LKW was 1815. At 2116 patient was seen by EDP/PA. See triage addendum note. Based on atypical presentation EDP/MD consulted and CODE stroke activated 2139. Pam RN escorted patient to CT scan at 2144. Returned from scan at 2152.

## 2023-12-26 NOTE — H&P (Signed)
 History and Physical    Juan Palmer:[id number] DOB: [birth year] DOA: 12/25/2023  PCP: Edison Gore, MD  Patient coming from: Home via emergency room  I have personally briefly reviewed patient's old medical records available.   Chief Complaint: Tingling left hand and left feet.  HPI: Juan Palmer is a 53 y.o. male with medical history significant of chronic hepatitis B under surveillance, GERD, no other active issues presents to the emergency room with few hours of tingling and numbness of the left side.  Patient does not have any significant history.  He is very active.  Yesterday about 6 PM he felt slight tingling and shooting pain on the left pinky finger that progressed to medial side of his forearm and also had transient tingling sensation going down his legs.  Patient did not have any other symptoms.  The symptoms lasted about 2 hours and they were improved by the time he arrived to the emergency room.  He drove himself to the ER. Denies any nausea vomiting dizziness lightheadedness.  Denies any facial droop.  Denies any visual changes.  Denies any change in his speech.  Denies any chest pain shortness of breath.  Bowel and bladder habits are normal.  Denies any abdominal pain.  He does not smoke.  Does not drink alcohol.  Denies any previous history of stroke. 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  ED Course: Hemodynamically stable.  Neurologically stable.  Electrolytes are adequate.  CT head without any findings.  Patient was sent to the hospital for TIA workup.  Seen by teleneuro.  Patient himself took 2 tablets of aspirin before coming to the emergency room.  He has been staying in the ER for last 20 hours.  Currently hungry.  No other symptoms.  Review of Systems: all systems are reviewed and pertinent positive as per HPI otherwise rest are negative.    Past Medical History:  Diagnosis Date   Constipation  10/23/2022   Hepatitis A immune 11/26/2021    Past Surgical History:  Procedure Laterality Date   COLONOSCOPY     UPPER GASTROINTESTINAL ENDOSCOPY      Social history   reports that he has never smoked. He has never used smokeless tobacco. He reports that he does not drink alcohol and does not use drugs.  No Known Allergies  Family History  Problem Relation Age of Onset   Healthy Mother    Healthy Father    Colon cancer Neg Hx    Stomach cancer Neg Hx    Esophageal cancer Neg Hx    Rectal cancer Neg Hx      Prior to Admission medications   Medication Sig Start Date End Date Taking? Authorizing Provider  nitroGLYCERIN (NITROSTAT) 0.3 MG SL tablet Place 1 tablet (0.3 mg total) under the tongue every 5 (five) minutes as needed for chest pain. 10/09/22  Yes Joelle Musca, MD  OPZELURA 1.5 % CREA Apply 1 Application topically 2 (two) times daily. 08/30/23  Yes [provider]  pantoprazole (PROTONIX) 40 MG tablet TAKE 1 TABLET BY MOUTH EVERY DAY 05/07/23  Yes Edison Gore, MD  Blood Pressure KIT 1 application  by Does not apply route as needed. 06/02/22   Joelle Musca, MD  sucralfate (CARAFATE) 1 g tablet TAKE 1 TABLET (1 G TOTAL) BY MOUTH 4 TIMES A DAY WITH MEALS AND AT BEDTIME Patient not taking: Reported on 12/26/2023 09/09/23  Edison Gore, MD    Physical Exam: Vitals:   12/26/23 0732 12/26/23 0733 12/26/23 1005 12/26/23 1244  BP: 104/80  109/70 115/80  Pulse: (!) 51 (!) 52 (!) 52   Resp: 18  16 13   Temp: 97.7 F (36.5 C)  97.9 F (36.6 C) 97.6 F (36.4 C)  TempSrc: Oral  Oral Oral  SpO2: 100% 100% 100% 100%  Weight:      Height:        Constitutional: NAD, calm, comfortable.  Pleasant and interactive. Vitals:   12/26/23 0732 12/26/23 0733 12/26/23 1005 12/26/23 1244  BP: 104/80  109/70 115/80  Pulse: (!) 51 (!) 52 (!) 52   Resp: 18  16 13   Temp: 97.7 F (36.5 C)  97.9 F (36.6 C) 97.6 F (36.4 C)  TempSrc: Oral  Oral Oral  SpO2: 100% 100% 100%  100%  Weight:      Height:       Eyes: PERRL, lids and conjunctivae normal ENMT: Mucous membranes are moist. Posterior pharynx clear of any exudate or lesions.Normal dentition.  Neck: normal, supple, no masses, no thyromegaly Respiratory: clear to auscultation bilaterally, no wheezing, no crackles. Normal respiratory effort. No accessory muscle use.  Cardiovascular: Regular rate and rhythm, no murmurs / rubs / gallops. No extremity edema. 2+ pedal pulses. No carotid bruits.  Abdomen: no tenderness, no masses palpated. No hepatosplenomegaly. Bowel sounds positive.  Musculoskeletal: no clubbing / cyanosis. No joint deformity upper and lower extremities. Good ROM, no contractures. Normal muscle tone.  Skin: no rashes, lesions, ulcers. No induration Neurologic: CN 2-12 grossly intact. Sensation intact, DTR normal. Strength 5/5 in all 4.  Psychiatric: Normal judgment and insight. Alert and oriented x 3. Normal mood.     Labs on Admission: I have personally reviewed following labs and imaging studies  CBC: Recent Labs  Lab 12/25/23 2120  WBC 5.3  NEUTROABS 2.8  HGB 13.6  HCT 38.0*  MCV 86.8  PLT 281   Basic Metabolic Panel: Recent Labs  Lab 12/25/23 2120  NA 130*  K 3.8  CL 96*  CO2 25  GLUCOSE 95  BUN 11  CREATININE 0.80  CALCIUM 8.6*   GFR: Estimated Creatinine Clearance: 90.1 mL/min (by C-G formula based on SCr of 0.8 mg/dL). Liver Function Tests: Recent Labs  Lab 12/25/23 2120  AST 25  ALT 20  ALKPHOS 49  BILITOT 0.6  PROT 7.2  ALBUMIN 4.0   No results for input(s): "LIPASE", "AMYLASE" in the last 168 hours. No results for input(s): "AMMONIA" in the last 168 hours. Coagulation Profile: Recent Labs  Lab 12/25/23 2120  INR 1.0   Cardiac Enzymes: No results for input(s): "CKTOTAL", "CKMB", "CKMBINDEX", "TROPONINI" in the last 168 hours. BNP (last 3 results) No results for input(s): "PROBNP" in the last 8760 hours. HbA1C: No results for input(s):  "HGBA1C" in the last 72 hours. CBG: Recent Labs  Lab 12/25/23 2116  GLUCAP 81   Lipid Profile: No results for input(s): "CHOL", "HDL", "LDLCALC", "TRIG", "CHOLHDL", "LDLDIRECT" in the last 72 hours. Thyroid Function Tests: No results for input(s): "TSH", "T4TOTAL", "FREET4", "T3FREE", "THYROIDAB" in the last 72 hours. Anemia Panel: No results for input(s): "VITAMINB12", "FOLATE", "FERRITIN", "TIBC", "IRON", "RETICCTPCT" in the last 72 hours. Urine analysis:    Component Value Date/Time   COLORURINE YELLOW 12/26/2023 0522   APPEARANCEUR CLEAR 12/26/2023 0522   LABSPEC 1.015 12/26/2023 0522   PHURINE 7.0 12/26/2023 0522   GLUCOSEU NEGATIVE 12/26/2023 0522   HGBUR NEGATIVE 12/26/2023  0522   BILIRUBINUR NEGATIVE 12/26/2023 0522   KETONESUR NEGATIVE 12/26/2023 0522   PROTEINUR NEGATIVE 12/26/2023 0522   NITRITE NEGATIVE 12/26/2023 0522   LEUKOCYTESUR NEGATIVE 12/26/2023 0522    Radiological Exams on Admission: CT HEAD CODE STROKE WO CONTRAST` Result Date: 12/25/2023 CLINICAL DATA:  Code stroke. Initial evaluation for acute neuro deficit, stroke suspected. EXAM: CT HEAD WITHOUT CONTRAST TECHNIQUE: Contiguous axial images were obtained from the base of the skull through the vertex without intravenous contrast. RADIATION DOSE REDUCTION: This exam was performed according to the departmental dose-optimization program which includes automated exposure control, adjustment of the mA and/or kV according to patient size and/or use of iterative reconstruction technique. COMPARISON:  None Available. FINDINGS: Brain: Cerebral volume within normal limits for patient age. No acute intracranial hemorrhage. No acute large vessel territory infarct. No mass lesion, midline shift, or mass effect. Ventricles are normal in size without hydrocephalus. No extra-axial fluid collection. Vascular: No abnormal hyperdense vessel. Skull: Scalp soft tissues demonstrate no acute abnormality. Calvarium intact.  Sinuses/Orbits: Globes and orbital soft tissues within normal limits. Visualized paranasal sinuses are largely clear. No significant mastoid effusion. ASPECTS [facility] Stroke Program Early CT Score) - Ganglionic level infarction (caudate, lentiform nuclei, internal capsule, insula, M1-M3 cortex): 7 - Supraganglionic infarction (M4-M6 cortex): 3 Total score (0-10 with 10 being normal): 10 IMPRESSION: 1. Normal head CT.  No acute intracranial abnormality. 2. ASPECTS is 10. Results were called by telephone at the time of interpretation on 12/25/2023 at 10:03 pm to provider [facility] , who verbally acknowledged these results. Electronically Signed   By: Virgia Griffins M.D.   On: 12/25/2023 22:03    EKG: Independently reviewed.  Normal sinus rhythm.  No acute T wave changes.  Assessment/Plan Principal Problem:   Stroke-like symptoms Active Problems:   Chronic hepatitis B (HCC)   TIA (transient ischemic attack)     1.  TIA: Admit to monitored unit because of initial presentation of TIA symptoms. Neurochecks and vital signs as per stroke protocol. Patient was not a TPA and vascular intervention candidate because of not having any deficits. Diet, passed  bedside swallow evaluation.  Can have regular diet. Antiplatelets, not on any medications at home.  Will start on aspirin 81 mg daily. Statin, not at home.  Will check fasting lipid profile. Blood pressure goals, normal.  No history of hypertension.  Currently not on medications. Consultations, neurology, speech, PT OT Initial CT scan without any acute findings. Will order MRI of the brain, CT angiogram of the head and neck and 2D echocardiogram. Keep on telemetry monitor to catch any arrhythmia. Neurology notified for routine consultation.  2.  Chronic hepatitis B: Stable.  Follows up with GI for surveillance.  3.  GERD: On PPI.  DVT prophylaxis: Lovenox subcu Code Status: Full code Family Communication: None at the  bedside Disposition Plan: Home Consults called: Neurology Admission status: Observation, cardiac telemetry   Vada Garibaldi MD [facility]

## 2023-12-26 NOTE — Progress Notes (Signed)
 NEUROLOGY PROGRESS NOTE   Date of service: December 26, 2023 Patient Name: PARMINDER CUPPLES MRN:  [id number] DOB:  [birth year] Chief Complaint: "numbness" Requesting Provider: Vada Garibaldi, MD  History of Present Illness  Carrick A Jeancharles is a 53 y.o. male with hx of chronic hepatitis, vitiligo, H. pylori, GERD who presented to [facility] 4/12 due to left-sided numbness, which spread to his face and leg.  Code stroke was activated and patient was seen by teleneurology.  On their exam patient was noted to have normal strength and speech with NIH of 1 for sensory deficit to left side.  Patient symptoms resolved within 2 hours.  CT head showed no acute hemorrhage or acute core infarct.  Patient was admitted for Stroke/TIA workup and transferred to [facility].  On Neurology consult exam today, no deficits.  NIHSS:  1a Level of Conscious.: 0 1b LOC Questions: 0 1c LOC Commands: 0 2 Best Gaze: 0 3 Visual: 0 4 Facial Palsy: 0 5a Motor Arm - left: 0 5b Motor Arm - Right:0  6a Motor Leg - Left: 0 6b Motor Leg - Right: 0 7 Limb Ataxia: 0 8 Sensory: 0 9 Best Language: 0 10 Dysarthria: 0 11 Extinct. and Inatten.: 0 TOTAL:     ROS  Comprehensive ROS performed and pertinent positives documented in HPI   Past History   Past Medical History:  Diagnosis Date   Constipation 10/23/2022   Hepatitis A immune 11/26/2021    Past Surgical History:  Procedure Laterality Date   COLONOSCOPY     UPPER GASTROINTESTINAL ENDOSCOPY      Family History: Family History  Problem Relation Age of Onset   Healthy Mother    Healthy Father    Colon cancer Neg Hx    Stomach cancer Neg Hx    Esophageal cancer Neg Hx    Rectal cancer Neg Hx     Social History  reports that he has never smoked. He has never used smokeless tobacco. He reports that he does not drink alcohol and does not use drugs.  No Known Allergies  Medications   Current Facility-Administered Medications:    [START ON  12/27/2023]  stroke: early stages of recovery book, , Does not apply, Once, Vada Garibaldi, MD   0.9 %  sodium chloride infusion, , Intravenous, PRN, Scarlette Currier, MD   acetaminophen (TYLENOL) tablet 650 mg, 650 mg, Oral, Q6H PRN **OR** acetaminophen (TYLENOL) suppository 650 mg, 650 mg, Rectal, Q6H PRN, Ghimire, Kuber, MD   aspirin EC tablet 81 mg, 81 mg, Oral, Daily, Ghimire, Kuber, MD, 81 mg at 12/26/23 1605   enoxaparin (LOVENOX) injection 40 mg, 40 mg, Subcutaneous, Q24H, Ghimire, Kuber, MD, 40 mg at 12/26/23 1606  Vitals   Vitals:   12/26/23 0732 12/26/23 0733 12/26/23 1005 12/26/23 1244  BP: 104/80  109/70 115/80  Pulse: (!) 51 (!) 52 (!) 52   Resp: 18  16 13   Temp: 97.7 F (36.5 C)  97.9 F (36.6 C) 97.6 F (36.4 C)  TempSrc: Oral  Oral Oral  SpO2: 100% 100% 100% 100%  Weight:      Height:        Body mass index is 16.51 kg/m.  Physical Exam   General: Laying comfortably in bed; in no acute distress.  HENT: Normal oropharynx and mucosa. Normal external appearance of ears and nose.  Neck: Supple, no pain or tenderness  CV: No JVD. No peripheral edema.  Pulmonary: Symmetric Chest rise. Normal respiratory effort.  Abdomen: Soft to touch, non-tender.  Ext: No cyanosis, edema, or deformity  Skin: No rash. Normal palpation of skin.   Musculoskeletal: Normal digits and nails by inspection. No clubbing.   Neurologic Examination  Mental status/Cognition: Alert, oriented to self, place, month and year, good attention.  Speech/language: Fluent, comprehension intact, object naming intact, repetition intact.  Cranial nerves:   CN II Pupils equal and reactive to light, no VF deficits    CN III,IV,VI EOM intact, no gaze preference or deviation, no nystagmus    CN V normal sensation in V1, V2, and V3 segments bilaterally    CN VII no asymmetry, no nasolabial fold flattening    CN VIII normal hearing to speech    CN IX & X normal palatal elevation, no uvular deviation    CN  XI 5/5 head turn and 5/5 shoulder shrug bilaterally    CN XII midline tongue protrusion    Motor:  Muscle bulk: normal, tone normal, pronator drift none tremor none Mvmt Root Nerve  Muscle Right Left Comments  SA C5/6 Ax Deltoid 5 5   EF C5/6 Mc Biceps 5 5   EE C6/7/8 Rad Triceps 5 5   WF C6/7 Med FCR     WE C7/8 PIN ECU     F Ab C8/T1 U ADM/FDI 5 5   HF L1/2/3 Fem Illopsoas 5 5   KE L2/3/4 Fem Quad 5 5   DF L4/5 D Peron Tib Ant 5 5   PF S1/2 Tibial Grc/Sol 5 5    Sensation:  Light touch Intact throughout   Pin prick    Temperature    Vibration   Proprioception    Coordination/Complex Motor:  - Finger to Nose intact BL - Heel to shin intact BL - Rapid alternating movement are normal - Gait: deferred.  Labs/Imaging/Neurodiagnostic studies   CBC:  Recent Labs  Lab 01-Jan-2024 2120  WBC 5.3  NEUTROABS 2.8  HGB 13.6  HCT 38.0*  MCV 86.8  PLT 281   Basic Metabolic Panel:  Lab Results  Component Value Date   NA 130 (L) January 01, 2024   K 3.8 01-01-24   CO2 25 January 01, 2024   GLUCOSE 95 01-01-24   BUN 11 01-01-2024   CREATININE 0.80 January 01, 2024   CALCIUM 8.6 (L) 01/01/2024   GFRNONAA >60 01/01/24   Lipid Panel:  Lab Results  Component Value Date   LDLCALC 71 06/02/2022   HgbA1c:  Lab Results  Component Value Date   HGBA1C 5.7 (H) 02/26/2023   Urine Drug Screen:     Component Value Date/Time   LABOPIA NONE DETECTED 12/26/2023 0522   COCAINSCRNUR NONE DETECTED 12/26/2023 0522   LABBENZ NONE DETECTED 12/26/2023 0522   AMPHETMU NONE DETECTED 12/26/2023 0522   THCU NONE DETECTED 12/26/2023 0522   LABBARB NONE DETECTED 12/26/2023 0522    Alcohol Level     Component Value Date/Time   ETH <10 2024/01/01 2120   INR  Lab Results  Component Value Date   INR 1.0 01-01-24   APTT  Lab Results  Component Value Date   APTT 34 01-01-2024   AED levels: No results found for: "PHENYTOIN", "ZONISAMIDE", "LAMOTRIGINE", "LEVETIRACETA"  CT Head without  contrast(Personally reviewed):  Normal head CT.  No acute intracranial abnormality.  ASPECTS is 10.  CT angio Head and Neck with contrast(Personally reviewed): PENDING  MRI Brain(Personally reviewed): No stroke, radiology read is pending.  ECHO: EF 55-60%   ASSESSMENT   Nicasio A Elsayed is a 53 y.o.  male with hx of chronic hepatitis, vitiligo, H. pylori, GERD who presented to med [facility] 4/12 due to left-sided numbness, which spread to his face and leg.    Code stroke was activated and patient was seen by teleneurology. NIH of 1 for sensory deficit to left side.  Patient symptoms resolved within 2 hours.  CT head showed no acute hemorrhage or acute core infarct.  Pending Lipid Panel, CT Angio to complete TIA workup.   RECOMMENDATIONS   - Frequent Neuro checks per stroke unit protocol - Vascular imaging - CT Angio - Lipid panel - Statin - will be started if LDL>70 or otherwise medically indicated - Antithrombotic - Continue Aspirin - DVT ppx - lovenox - SBP goal - Normotensive as patient is outside of permissive hypertension parameters - Telemetry monitoring for arrhythmia - 72h - Swallow screen - will be performed prior to PO intake - Stroke education - will be given - PT/OT/SLP  ______________________________________________________________________   Signed,  Latondra Gebhart [facility]

## 2023-12-26 NOTE — Progress Notes (Signed)
 Echocardiogram 2D Echocardiogram has been performed.  Juan Palmer 12/26/2023, 3:59 PM

## 2023-12-27 ENCOUNTER — Other Ambulatory Visit (HOSPITAL_COMMUNITY): Payer: Self-pay

## 2023-12-27 DIAGNOSIS — R200 Anesthesia of skin: Principal | ICD-10-CM

## 2023-12-27 DIAGNOSIS — B181 Chronic viral hepatitis B without delta-agent: Secondary | ICD-10-CM | POA: Diagnosis not present

## 2023-12-27 DIAGNOSIS — G459 Transient cerebral ischemic attack, unspecified: Secondary | ICD-10-CM | POA: Diagnosis not present

## 2023-12-27 DIAGNOSIS — E785 Hyperlipidemia, unspecified: Secondary | ICD-10-CM

## 2023-12-27 DIAGNOSIS — R202 Paresthesia of skin: Secondary | ICD-10-CM | POA: Diagnosis not present

## 2023-12-27 LAB — LIPID PANEL
Cholesterol: 199 mg/dL (ref 0–200)
HDL: 62 mg/dL (ref >40)
LDL Cholesterol: 124 mg/dL — ABNORMAL HIGH (ref 0–99)
Total CHOL/HDL Ratio: 3.2 ratio
Triglycerides: 65 mg/dL (ref <150)
VLDL: 13 mg/dL (ref 0–40)

## 2023-12-27 MED ORDER — ATORVASTATIN CALCIUM 40 MG PO TABS
40.0000 mg | ORAL_TABLET | Freq: Every day | ORAL | 1 refills | Status: DC
  Filled 2023-12-27: qty 30, 30d supply, fill #0

## 2023-12-27 MED ORDER — ASPIRIN 81 MG PO TBEC
81.0000 mg | DELAYED_RELEASE_TABLET | Freq: Every day | ORAL | 12 refills | Status: AC
  Filled 2023-12-27: qty 30, 30d supply, fill #0

## 2023-12-27 MED ORDER — ATORVASTATIN CALCIUM 40 MG PO TABS
40.0000 mg | ORAL_TABLET | Freq: Every day | ORAL | Status: DC
  Administered 2023-12-27: 40 mg via ORAL
  Filled 2023-12-27: qty 1

## 2023-12-27 MED ORDER — CLOPIDOGREL BISULFATE 75 MG PO TABS
75.0000 mg | ORAL_TABLET | Freq: Every day | ORAL | Status: DC
  Administered 2023-12-27: 75 mg via ORAL
  Filled 2023-12-27: qty 1

## 2023-12-27 MED ORDER — CLOPIDOGREL BISULFATE 75 MG PO TABS
75.0000 mg | ORAL_TABLET | Freq: Every day | ORAL | 0 refills | Status: DC
  Filled 2023-12-27: qty 21, 21d supply, fill #0

## 2023-12-27 NOTE — Discharge Summary (Signed)
 PATIENT DETAILS Name: Juan Palmer Age: 53 y.o. Sex: male Date of Birth: [birth year] MRN: [id number]. Admitting Physician: Dorcas Carrow, MD WUJ:[id number], Unknown Foley, MD  Admit Date: 12/25/2023 Discharge date: 12/27/2023  Recommendations for Outpatient Follow-up:  Follow up with PCP in 1-2 weeks Please obtain CMP/CBC in one week  Admitted From:  Home  Disposition: Home   Discharge Condition: good  CODE STATUS:   Code Status: Full Code   Diet recommendation:  Diet Order             Diet - low sodium heart healthy           Diet regular Room service appropriate? Yes; Fluid consistency: Thin  Diet effective now                    Brief Summary: 53 year old with chronic hepatitis B-presented with transient left-sided paresthesias.  Significant studies MRI brain: No CVA CTA head/neck: No significant stenosis/LVO Echo: EF 55-60% LDL: 124 A1c: 5.2  Brief Hospital Course: TIA Presenting symptoms of paresthesia have resolved Workup as above Discussed with stroke MD-Dr. Xu-aspirin/Plavix x 3 weeks followed by aspirin alone.  Continue high intensity statin on discharge Outpatient follow-up with neurology  Rest of his medical issues were stable during the short overnight hospitalization.  Discharge Diagnoses:  Principal Problem:   Stroke-like symptoms Active Problems:   Chronic hepatitis B (HCC)   TIA (transient ischemic attack)   Discharge Instructions:  Activity:  As tolerated   Discharge Instructions     Call MD for:  extreme fatigue   Complete by: As directed    Call MD for:  persistant dizziness or light-headedness   Complete by: As directed    Diet - low sodium heart healthy   Complete by: As directed    Discharge instructions   Complete by: As directed    Follow with Primary MD  Vonna Drafts, MD in 1-2 weeks  Aspirin/Plavix x 3 weeks-then stop Plavix-continue on aspirin   Follow-up with [facility]-they will call you  with a follow-up appointment-if you do not hear from them-please give them a call.  Please get a complete blood count and chemistry panel checked by your Primary MD at your next visit, and again as instructed by your Primary MD.  Get Medicines reviewed and adjusted: Please take all your medications with you for your next visit with your Primary MD  Laboratory/radiological data: Please request your Primary MD to go over all hospital tests and procedure/radiological results at the follow up, please ask your Primary MD to get all Hospital records sent to his/her office.  In some cases, they will be blood work, cultures and biopsy results pending at the time of your discharge. Please request that your primary care M.D. follows up on these results.  Also Note the following: If you experience worsening of your admission symptoms, develop shortness of breath, life threatening emergency, suicidal or homicidal thoughts you must seek medical attention immediately by calling 911 or calling your MD immediately  if symptoms less severe.  You must read complete instructions/literature along with all the possible adverse reactions/side effects for all the Medicines you take and that have been prescribed to you. Take any new Medicines after you have completely understood and accpet all the possible adverse reactions/side effects.   Do not drive when taking Pain medications or sleeping medications (Benzodaizepines)  Do not take more than prescribed Pain, Sleep and Anxiety Medications. It is not advisable to combine anxiety,sleep and  pain medications without talking with your primary care practitioner  Special Instructions: If you have smoked or chewed Tobacco  in the last 2 yrs please stop smoking, stop any regular Alcohol  and or any Recreational drug use.  Wear Seat belts while driving.  Please note: You were cared for by a hospitalist during your hospital stay. Once you are discharged, your primary care  physician will handle any further medical issues. Please note that NO REFILLS for any discharge medications will be authorized once you are discharged, as it is imperative that you return to your primary care physician (or establish a relationship with a primary care physician if you do not have one) for your post hospital discharge needs so that they can reassess your need for medications and monitor your lab values.   Increase activity slowly   Complete by: As directed       Allergies as of 12/27/2023   No Known Allergies      Medication List     STOP taking these medications    sucralfate 1 g tablet Commonly known as: CARAFATE       TAKE these medications    aspirin EC 81 MG tablet Take 1 tablet (81 mg total) by mouth daily. Swallow whole. Start taking on: December 28, 2023   atorvastatin 40 MG tablet Commonly known as: LIPITOR Take 1 tablet (40 mg total) by mouth daily. Start taking on: December 28, 2023   Blood Pressure Kit 1 application  by Does not apply route as needed.   clopidogrel 75 MG tablet Commonly known as: PLAVIX Take 1 tablet (75 mg total) by mouth daily. Start taking on: December 28, 2023   nitroGLYCERIN 0.3 MG SL tablet Commonly known as: Nitrostat Place 1 tablet (0.3 mg total) under the tongue every 5 (five) minutes as needed for chest pain.   Opzelura 1.5 % Crea Generic drug: Ruxolitinib Phosphate Apply 1 Application topically 2 (two) times daily.   pantoprazole 40 MG tablet Commonly known as: PROTONIX TAKE 1 TABLET BY MOUTH EVERY DAY        Follow-up Information     Edison Gore, MD. Schedule an appointment as soon as possible for a visit in 1 week(s).   Specialty: Family Medicine Contact information: [address] [place] [address] [phone]         [facility] Follow up.   Specialty: Neurology Why: Office will call with date/time, If you dont hear from them,please give them a call Contact  information: [address] [place]  [address] [phone]               No Known Allergies   Other Procedures/Studies: CT ANGIO HEAD NECK W WO CM Result Date: 12/26/2023 CLINICAL DATA:  Initial evaluation for acute TIA. EXAM: CT ANGIOGRAPHY HEAD AND NECK WITH AND WITHOUT CONTRAST TECHNIQUE: Multidetector CT imaging of the head and neck was performed using the standard protocol during bolus administration of intravenous contrast. Multiplanar CT image reconstructions and MIPs were obtained to evaluate the vascular anatomy. Carotid stenosis measurements (when applicable) are obtained utilizing NASCET criteria, using the distal internal carotid diameter as the denominator. RADIATION DOSE REDUCTION: This exam was performed according to the departmental dose-optimization program which includes automated exposure control, adjustment of the mA and/or kV according to patient size and/or use of iterative reconstruction technique. CONTRAST:  75mL OMNIPAQUE IOHEXOL 350 MG/ML SOLN COMPARISON:  None Available. FINDINGS: CT HEAD FINDINGS Brain: Cerebral volume within  normal limits for patient age. No acute intracranial hemorrhage. No acute large vessel territory infarct. No mass lesion, midline shift, or mass effect. Ventricles are normal in size without hydrocephalus. No extra-axial fluid collection. Vascular: No abnormal hyperdense vessel. Skull: Scalp soft tissues demonstrate no acute abnormality. Calvarium intact. Sinuses/Orbits: Globes and orbital soft tissues within normal limits. Visualized paranasal sinuses are largely clear. No significant mastoid effusion. CTA NECK FINDINGS Aortic arch: Standard branching. Imaged portion shows no evidence of aneurysm or dissection. No significant stenosis of the major arch vessel origins. Right carotid system: No evidence of dissection, stenosis (50% or greater), or occlusion. Left carotid system: No evidence of dissection, stenosis (50% or  greater), or occlusion. Vertebral arteries: Codominant. No evidence of dissection, stenosis (50% or greater), Skeleton: No worrisome osseous lesions. Other neck: No other acute finding. Upper chest: No other acute finding. Review of the MIP images confirms the above findings CTA HEAD FINDINGS Anterior circulation: Both internal carotid arteries are widely patent through the siphons without stenosis. Left A1 segment dominant and widely patent. Right A1 hypoplastic and/or absent, accounting for the diminutive right ICA is compared to the left. Normal anterior communicating artery complex. Anterior cerebral arteries patent without stenosis. No M1 stenosis or occlusion. Distal MCA branches perfused and symmetric. Posterior circulation: Both V4 segments patent without significant stenosis. Left vertebral artery dominant. Both PICA patent. Basilar widely patent without stenosis. Superior cerebral arteries patent bilaterally. Both PCAs primarily supplied via the basilar. PCAs are patent to their distal aspects without significant stenosis. Venous sinuses: Patent allowing for timing the contrast bolus. Anatomic variants: As above.  No aneurysm. Review of the MIP images confirms the above findings IMPRESSION: 1. Normal CTA of the head and neck. No large vessel occlusion, hemodynamically significant stenosis, or other acute vascular abnormality. 2. No other acute intracranial abnormality. Electronically Signed   By: Rise Mu M.D.   On: 12/26/2023 21:31   MR BRAIN WO CONTRAST Result Date: 12/26/2023 CLINICAL DATA:  Initial evaluation for acute TIA. EXAM: MRI HEAD WITHOUT CONTRAST TECHNIQUE: Multiplanar, multiecho pulse sequences of the brain and surrounding structures were obtained without intravenous contrast. COMPARISON:  CT from 12/25/2023. FINDINGS: Brain: Cerebral volume within normal limits for age. No focal parenchymal signal abnormality. No abnormal foci of restricted diffusion to suggest acute or  subacute ischemia. Gray-white matter differentiation well maintained. No encephalomalacia to suggest chronic cortical infarction or other insult. No foci of susceptibility artifact indicative of acute or chronic intracranial blood products. No mass lesion, midline shift or mass effect. Ventricles normal in size and morphology without hydrocephalus. No extra-axial fluid collection. Pituitary gland and suprasellar region within normal limits. Vascular: Major intracranial vascular flow voids are well maintained. Skull and upper cervical spine: Craniocervical junction within normal limits. Visualized upper cervical spine demonstrates no significant finding. Bone marrow signal intensity within normal limits. No scalp soft tissue abnormality. Sinuses/Orbits: Globes and orbital soft tissues are within normal limits. Paranasal sinuses are largely clear. No significant mastoid effusion. Other: None. IMPRESSION: Normal brain MRI. No acute intracranial abnormality identified. Electronically Signed   By: Rise Mu M.D.   On: 12/26/2023 21:18   ECHOCARDIOGRAM COMPLETE Result Date: 12/26/2023    ECHOCARDIOGRAM REPORT   Patient Name:   KYSER A Fernholz Date of Exam: 12/26/2023 Medical Rec #:  [id number]    Height:       74.0 in Accession #:    [id number]   Weight:       128.6 lb Date of Birth:  01/26/1971    BSA:          1.802 m Patient Age:    52 years     BP:           115/80 mmHg Patient Gender: M            HR:           54 bpm. Exam Location:  Inpatient Procedure: 2D Echo, Cardiac Doppler and Color Doppler (Both Spectral and Color            Flow Doppler were utilized during procedure). Indications:    TIA G45.9  History:        Patient has prior history of Echocardiogram examinations, most                 recent 12/18/2021. TIA.  Sonographer:    Terrilee Few RCS Referring Phys: [id number] KUBER Perez Dirico IMPRESSIONS  1. Left ventricular ejection fraction, by estimation, is 55 to 60%. The left ventricle has normal  function. The left ventricle has no regional wall motion abnormalities. Left ventricular diastolic parameters were normal.  2. Right ventricular systolic function is normal. The right ventricular size is normal.  3. The mitral valve is grossly normal. Mild mitral valve regurgitation. No evidence of mitral stenosis.  4. The aortic valve is tricuspid. Aortic valve regurgitation is not visualized. No aortic stenosis is present.  5. The inferior vena cava is normal in size with greater than 50% respiratory variability, suggesting right atrial pressure of 3 mmHg. Comparison(s): Prior images reviewed side by side. Similar function; Mild mitral regurgitation. FINDINGS  Left Ventricle: No strain or 3D transmitted. Left ventricular ejection fraction, by estimation, is 55 to 60%. The left ventricle has normal function. The left ventricle has no regional wall motion abnormalities. The left ventricular internal cavity size  was normal in size. There is no left ventricular hypertrophy. Left ventricular diastolic parameters were normal. Right Ventricle: The right ventricular size is normal. No increase in right ventricular wall thickness. Right ventricular systolic function is normal. Left Atrium: Left atrial size was normal in size. Right Atrium: Right atrial size was normal in size. Pericardium: There is no evidence of pericardial effusion. Mitral Valve: The mitral valve is grossly normal. Mild mitral valve regurgitation. No evidence of mitral valve stenosis. Tricuspid Valve: The tricuspid valve is normal in structure. Tricuspid valve regurgitation is not demonstrated. No evidence of tricuspid stenosis. Aortic Valve: The aortic valve is tricuspid. Aortic valve regurgitation is not visualized. No aortic stenosis is present. Aortic valve peak gradient measures 3.6 mmHg. Pulmonic Valve: The pulmonic valve was not well visualized. Pulmonic valve regurgitation is not visualized. No evidence of pulmonic stenosis. Aorta: The aortic  root and ascending aorta are structurally normal, with no evidence of dilitation. Venous: The inferior vena cava is normal in size with greater than 50% respiratory variability, suggesting right atrial pressure of 3 mmHg. IAS/Shunts: The atrial septum is grossly normal.  LEFT VENTRICLE PLAX 2D LVIDd:         3.20 cm   Diastology LVIDs:         2.30 cm   LV e' medial:    13.10 cm/s LV PW:         1.10 cm   LV E/e' medial:  4.4 LV IVS:        0.90 cm   LV e' lateral:   14.40 cm/s LVOT diam:     2.00 cm   LV E/e' lateral: 4.0 LV  SV:         51 LV SV Index:   28 LVOT Area:     3.14 cm  RIGHT VENTRICLE             IVC RV S prime:     10.30 cm/s  IVC diam: 1.90 cm TAPSE (M-mode): 1.9 cm LEFT ATRIUM             Index        RIGHT ATRIUM           Index LA diam:        2.40 cm 1.33 cm/m   RA Area:     11.70 cm LA Vol (A2C):   23.4 ml 12.99 ml/m  RA Volume:   23.70 ml  13.16 ml/m LA Vol (A4C):   31.0 ml 17.21 ml/m LA Biplane Vol: 27.8 ml 15.43 ml/m  AORTIC VALVE AV Area (Vmax): 2.77 cm AV Vmax:        95.00 cm/s AV Peak Grad:   3.6 mmHg LVOT Vmax:      83.70 cm/s LVOT Vmean:     47.600 cm/s LVOT VTI:       0.163 m  AORTA Ao Root diam: 3.30 cm Ao Asc diam:  2.90 cm MITRAL VALVE MV Area (PHT): 3.37 cm    SHUNTS MV Decel Time: 225 msec    Systemic VTI:  0.16 m MR Peak grad: 60.5 mmHg    Systemic Diam: 2.00 cm MR Vmax:      389.00 cm/s MV E velocity: 57.10 cm/s MV A velocity: 60.30 cm/s MV E/A ratio:  0.95 Riley Lam MD Electronically signed by Riley Lam MD Signature Date/Time: 12/26/2023/4:27:47 PM    Final    CT HEAD CODE STROKE WO CONTRAST` Result Date: 12/25/2023 CLINICAL DATA:  Code stroke. Initial evaluation for acute neuro deficit, stroke suspected. EXAM: CT HEAD WITHOUT CONTRAST TECHNIQUE: Contiguous axial images were obtained from the base of the skull through the vertex without intravenous contrast. RADIATION DOSE REDUCTION: This exam was performed according to the departmental  dose-optimization program which includes automated exposure control, adjustment of the mA and/or kV according to patient size and/or use of iterative reconstruction technique. COMPARISON:  None Available. FINDINGS: Brain: Cerebral volume within normal limits for patient age. No acute intracranial hemorrhage. No acute large vessel territory infarct. No mass lesion, midline shift, or mass effect. Ventricles are normal in size without hydrocephalus. No extra-axial fluid collection. Vascular: No abnormal hyperdense vessel. Skull: Scalp soft tissues demonstrate no acute abnormality. Calvarium intact. Sinuses/Orbits: Globes and orbital soft tissues within normal limits. Visualized paranasal sinuses are largely clear. No significant mastoid effusion. ASPECTS [facility] Stroke Program Early CT Score) - Ganglionic level infarction (caudate, lentiform nuclei, internal capsule, insula, M1-M3 cortex): 7 - Supraganglionic infarction (M4-M6 cortex): 3 Total score (0-10 with 10 being normal): 10 IMPRESSION: 1. Normal head CT.  No acute intracranial abnormality. 2. ASPECTS is 10. Results were called by telephone at the time of interpretation on 12/25/2023 at 10:03 pm to provider [facility] , who verbally acknowledged these results. Electronically Signed   By: Rise Mu M.D.   On: 12/25/2023 22:03     TODAY-DAY OF DISCHARGE:  Subjective:   Juan Palmer today has no headache,no chest abdominal pain,no new weakness tingling or numbness, feels much better wants to go home today.   Objective:   Blood pressure 109/70, pulse (!) 53, temperature (!) 97.4 F (36.3 C), temperature source Oral, resp. rate 10, height 6\' 2"  (  1.88 m), weight 58.3 kg, SpO2 98%.  Intake/Output Summary (Last 24 hours) at 12/27/2023 0914 Last data filed at 12/26/2023 1600 Gross per 24 hour  Intake 240 ml  Output --  Net 240 ml   Filed Weights   12/25/23 2118  Weight: 58.3 kg    Exam: Awake Alert, Oriented *3, No new F.N  deficits, Normal affect [place].AT,PERRAL Supple Neck,No JVD, No cervical lymphadenopathy appriciated.  Symmetrical Chest wall movement, Good air movement bilaterally, CTAB RRR,No Gallops,Rubs or new Murmurs, No Parasternal Heave +ve B.Sounds, Abd Soft, Non tender, No organomegaly appriciated, No rebound -guarding or rigidity. No Cyanosis, Clubbing or edema, No new Rash or bruise   PERTINENT RADIOLOGIC STUDIES: CT ANGIO HEAD NECK W WO CM Result Date: 12/26/2023 CLINICAL DATA:  Initial evaluation for acute TIA. EXAM: CT ANGIOGRAPHY HEAD AND NECK WITH AND WITHOUT CONTRAST TECHNIQUE: Multidetector CT imaging of the head and neck was performed using the standard protocol during bolus administration of intravenous contrast. Multiplanar CT image reconstructions and MIPs were obtained to evaluate the vascular anatomy. Carotid stenosis measurements (when applicable) are obtained utilizing NASCET criteria, using the distal internal carotid diameter as the denominator. RADIATION DOSE REDUCTION: This exam was performed according to the departmental dose-optimization program which includes automated exposure control, adjustment of the mA and/or kV according to patient size and/or use of iterative reconstruction technique. CONTRAST:  75mL OMNIPAQUE IOHEXOL 350 MG/ML SOLN COMPARISON:  None Available. FINDINGS: CT HEAD FINDINGS Brain: Cerebral volume within normal limits for patient age. No acute intracranial hemorrhage. No acute large vessel territory infarct. No mass lesion, midline shift, or mass effect. Ventricles are normal in size without hydrocephalus. No extra-axial fluid collection. Vascular: No abnormal hyperdense vessel. Skull: Scalp soft tissues demonstrate no acute abnormality. Calvarium intact. Sinuses/Orbits: Globes and orbital soft tissues within normal limits. Visualized paranasal sinuses are largely clear. No significant mastoid effusion. CTA NECK FINDINGS Aortic arch: Standard branching. Imaged portion  shows no evidence of aneurysm or dissection. No significant stenosis of the major arch vessel origins. Right carotid system: No evidence of dissection, stenosis (50% or greater), or occlusion. Left carotid system: No evidence of dissection, stenosis (50% or greater), or occlusion. Vertebral arteries: Codominant. No evidence of dissection, stenosis (50% or greater), Skeleton: No worrisome osseous lesions. Other neck: No other acute finding. Upper chest: No other acute finding. Review of the MIP images confirms the above findings CTA HEAD FINDINGS Anterior circulation: Both internal carotid arteries are widely patent through the siphons without stenosis. Left A1 segment dominant and widely patent. Right A1 hypoplastic and/or absent, accounting for the diminutive right ICA is compared to the left. Normal anterior communicating artery complex. Anterior cerebral arteries patent without stenosis. No M1 stenosis or occlusion. Distal MCA branches perfused and symmetric. Posterior circulation: Both V4 segments patent without significant stenosis. Left vertebral artery dominant. Both PICA patent. Basilar widely patent without stenosis. Superior cerebral arteries patent bilaterally. Both PCAs primarily supplied via the basilar. PCAs are patent to their distal aspects without significant stenosis. Venous sinuses: Patent allowing for timing the contrast bolus. Anatomic variants: As above.  No aneurysm. Review of the MIP images confirms the above findings IMPRESSION: 1. Normal CTA of the head and neck. No large vessel occlusion, hemodynamically significant stenosis, or other acute vascular abnormality. 2. No other acute intracranial abnormality. Electronically Signed   By: Virgia Griffins M.D.   On: 12/26/2023 21:31   MR BRAIN WO CONTRAST Result Date: 12/26/2023 CLINICAL DATA:  Initial evaluation for acute TIA. EXAM:  MRI HEAD WITHOUT CONTRAST TECHNIQUE: Multiplanar, multiecho pulse sequences of the brain and surrounding  structures were obtained without intravenous contrast. COMPARISON:  CT from 12/25/2023. FINDINGS: Brain: Cerebral volume within normal limits for age. No focal parenchymal signal abnormality. No abnormal foci of restricted diffusion to suggest acute or subacute ischemia. Gray-white matter differentiation well maintained. No encephalomalacia to suggest chronic cortical infarction or other insult. No foci of susceptibility artifact indicative of acute or chronic intracranial blood products. No mass lesion, midline shift or mass effect. Ventricles normal in size and morphology without hydrocephalus. No extra-axial fluid collection. Pituitary gland and suprasellar region within normal limits. Vascular: Major intracranial vascular flow voids are well maintained. Skull and upper cervical spine: Craniocervical junction within normal limits. Visualized upper cervical spine demonstrates no significant finding. Bone marrow signal intensity within normal limits. No scalp soft tissue abnormality. Sinuses/Orbits: Globes and orbital soft tissues are within normal limits. Paranasal sinuses are largely clear. No significant mastoid effusion. Other: None. IMPRESSION: Normal brain MRI. No acute intracranial abnormality identified. Electronically Signed   By: Rise Mu M.D.   On: 12/26/2023 21:18   ECHOCARDIOGRAM COMPLETE Result Date: 12/26/2023    ECHOCARDIOGRAM REPORT   Patient Name:   Audry A Fogg Date of Exam: 12/26/2023 Medical Rec #:  [id number]    Height:       74.0 in Accession #:    [id number]   Weight:       128.6 lb Date of Birth:  [birth year]    BSA:          1.802 m Patient Age:    52 years     BP:           115/80 mmHg Patient Gender: M            HR:           54 bpm. Exam Location:  Inpatient Procedure: 2D Echo, Cardiac Doppler and Color Doppler (Both Spectral and Color            Flow Doppler were utilized during procedure). Indications:    TIA G45.9  History:        Patient has prior history of  Echocardiogram examinations, most                 recent 12/18/2021. TIA.  Sonographer:    Lucendia Herrlich RCS Referring Phys: [id number] KUBER Marizol Borror IMPRESSIONS  1. Left ventricular ejection fraction, by estimation, is 55 to 60%. The left ventricle has normal function. The left ventricle has no regional wall motion abnormalities. Left ventricular diastolic parameters were normal.  2. Right ventricular systolic function is normal. The right ventricular size is normal.  3. The mitral valve is grossly normal. Mild mitral valve regurgitation. No evidence of mitral stenosis.  4. The aortic valve is tricuspid. Aortic valve regurgitation is not visualized. No aortic stenosis is present.  5. The inferior vena cava is normal in size with greater than 50% respiratory variability, suggesting right atrial pressure of 3 mmHg. Comparison(s): Prior images reviewed side by side. Similar function; Mild mitral regurgitation. FINDINGS  Left Ventricle: No strain or 3D transmitted. Left ventricular ejection fraction, by estimation, is 55 to 60%. The left ventricle has normal function. The left ventricle has no regional wall motion abnormalities. The left ventricular internal cavity size  was normal in size. There is no left ventricular hypertrophy. Left ventricular diastolic parameters were normal. Right Ventricle: The right ventricular size is normal. No increase in right ventricular wall thickness.  Right ventricular systolic function is normal. Left Atrium: Left atrial size was normal in size. Right Atrium: Right atrial size was normal in size. Pericardium: There is no evidence of pericardial effusion. Mitral Valve: The mitral valve is grossly normal. Mild mitral valve regurgitation. No evidence of mitral valve stenosis. Tricuspid Valve: The tricuspid valve is normal in structure. Tricuspid valve regurgitation is not demonstrated. No evidence of tricuspid stenosis. Aortic Valve: The aortic valve is tricuspid. Aortic valve regurgitation  is not visualized. No aortic stenosis is present. Aortic valve peak gradient measures 3.6 mmHg. Pulmonic Valve: The pulmonic valve was not well visualized. Pulmonic valve regurgitation is not visualized. No evidence of pulmonic stenosis. Aorta: The aortic root and ascending aorta are structurally normal, with no evidence of dilitation. Venous: The inferior vena cava is normal in size with greater than 50% respiratory variability, suggesting right atrial pressure of 3 mmHg. IAS/Shunts: The atrial septum is grossly normal.  LEFT VENTRICLE PLAX 2D LVIDd:         3.20 cm   Diastology LVIDs:         2.30 cm   LV e' medial:    13.10 cm/s LV PW:         1.10 cm   LV E/e' medial:  4.4 LV IVS:        0.90 cm   LV e' lateral:   14.40 cm/s LVOT diam:     2.00 cm   LV E/e' lateral: 4.0 LV SV:         51 LV SV Index:   28 LVOT Area:     3.14 cm  RIGHT VENTRICLE             IVC RV S prime:     10.30 cm/s  IVC diam: 1.90 cm TAPSE (M-mode): 1.9 cm LEFT ATRIUM             Index        RIGHT ATRIUM           Index LA diam:        2.40 cm 1.33 cm/m   RA Area:     11.70 cm LA Vol (A2C):   23.4 ml 12.99 ml/m  RA Volume:   23.70 ml  13.16 ml/m LA Vol (A4C):   31.0 ml 17.21 ml/m LA Biplane Vol: 27.8 ml 15.43 ml/m  AORTIC VALVE AV Area (Vmax): 2.77 cm AV Vmax:        95.00 cm/s AV Peak Grad:   3.6 mmHg LVOT Vmax:      83.70 cm/s LVOT Vmean:     47.600 cm/s LVOT VTI:       0.163 m  AORTA Ao Root diam: 3.30 cm Ao Asc diam:  2.90 cm MITRAL VALVE MV Area (PHT): 3.37 cm    SHUNTS MV Decel Time: 225 msec    Systemic VTI:  0.16 m MR Peak grad: 60.5 mmHg    Systemic Diam: 2.00 cm MR Vmax:      389.00 cm/s MV E velocity: 57.10 cm/s MV A velocity: 60.30 cm/s MV E/A ratio:  0.95 Gloriann Larger MD Electronically signed by Gloriann Larger MD Signature Date/Time: 12/26/2023/4:27:47 PM    Final    CT HEAD CODE STROKE WO CONTRAST` Result Date: 12/25/2023 CLINICAL DATA:  Code stroke. Initial evaluation for acute neuro deficit, stroke  suspected. EXAM: CT HEAD WITHOUT CONTRAST TECHNIQUE: Contiguous axial images were obtained from the base of the skull through the vertex without intravenous contrast. RADIATION DOSE REDUCTION:  This exam was performed according to the departmental dose-optimization program which includes automated exposure control, adjustment of the mA and/or kV according to patient size and/or use of iterative reconstruction technique. COMPARISON:  None Available. FINDINGS: Brain: Cerebral volume within normal limits for patient age. No acute intracranial hemorrhage. No acute large vessel territory infarct. No mass lesion, midline shift, or mass effect. Ventricles are normal in size without hydrocephalus. No extra-axial fluid collection. Vascular: No abnormal hyperdense vessel. Skull: Scalp soft tissues demonstrate no acute abnormality. Calvarium intact. Sinuses/Orbits: Globes and orbital soft tissues within normal limits. Visualized paranasal sinuses are largely clear. No significant mastoid effusion. ASPECTS [facility] Stroke Program Early CT Score) - Ganglionic level infarction (caudate, lentiform nuclei, internal capsule, insula, M1-M3 cortex): 7 - Supraganglionic infarction (M4-M6 cortex): 3 Total score (0-10 with 10 being normal): 10 IMPRESSION: 1. Normal head CT.  No acute intracranial abnormality. 2. ASPECTS is 10. Results were called by telephone at the time of interpretation on 12/25/2023 at 10:03 pm to provider [facility] , who verbally acknowledged these results. Electronically Signed   By: Virgia Griffins M.D.   On: 12/25/2023 22:03     PERTINENT LAB RESULTS: CBC: Recent Labs    12/25/23 2120  WBC 5.3  HGB 13.6  HCT 38.0*  PLT 281   CMET CMP     Component Value Date/Time   NA 130 (L) 12/25/2023 2120   NA 133 (L) 02/26/2023 1722   K 3.8 12/25/2023 2120   CL 96 (L) 12/25/2023 2120   CO2 25 12/25/2023 2120   GLUCOSE 95 12/25/2023 2120   BUN 11 12/25/2023 2120   BUN 15 02/26/2023 1722    CREATININE 0.80 12/25/2023 2120   CALCIUM 8.6 (L) 12/25/2023 2120   PROT 7.2 12/25/2023 2120   PROT 6.4 02/26/2023 1722   ALBUMIN 4.0 12/25/2023 2120   ALBUMIN 4.1 02/26/2023 1722   AST 25 12/25/2023 2120   ALT 20 12/25/2023 2120   ALT 21 11/26/2021 1519   ALKPHOS 49 12/25/2023 2120   BILITOT 0.6 12/25/2023 2120   BILITOT 0.5 02/26/2023 1722   GFR 99.62 09/30/2023 1507   EGFR 94 02/26/2023 1722   GFRNONAA >60 12/25/2023 2120    GFR Estimated Creatinine Clearance: 90.1 mL/min (by C-G formula based on SCr of 0.8 mg/dL). No results for input(s): "LIPASE", "AMYLASE" in the last 72 hours. No results for input(s): "CKTOTAL", "CKMB", "CKMBINDEX", "TROPONINI" in the last 72 hours. Invalid input(s): "POCBNP" No results for input(s): "DDIMER" in the last 72 hours. Recent Labs    12/26/23 1634  HGBA1C 5.2   Recent Labs    12/27/23 0348  CHOL 199  HDL 62  LDLCALC 124*  TRIG 65  CHOLHDL 3.2   No results for input(s): "TSH", "T4TOTAL", "T3FREE", "THYROIDAB" in the last 72 hours.  Invalid input(s): "FREET3" No results for input(s): "VITAMINB12", "FOLATE", "FERRITIN", "TIBC", "IRON", "RETICCTPCT" in the last 72 hours. Coags: Recent Labs    12/25/23 2120  INR 1.0   Microbiology: No results found for this or any previous visit (from the past 240 hours).  FURTHER DISCHARGE INSTRUCTIONS:  Get Medicines reviewed and adjusted: Please take all your medications with you for your next visit with your Primary MD  Laboratory/radiological data: Please request your Primary MD to go over all hospital tests and procedure/radiological results at the follow up, please ask your Primary MD to get all Hospital records sent to his/her office.  In some cases, they will be blood work, cultures and biopsy results  pending at the time of your discharge. Please request that your primary care M.D. goes through all the records of your hospital data and follows up on these results.  Also Note the  following: If you experience worsening of your admission symptoms, develop shortness of breath, life threatening emergency, suicidal or homicidal thoughts you must seek medical attention immediately by calling 911 or calling your MD immediately  if symptoms less severe.  You must read complete instructions/literature along with all the possible adverse reactions/side effects for all the Medicines you take and that have been prescribed to you. Take any new Medicines after you have completely understood and accpet all the possible adverse reactions/side effects.   Do not drive when taking Pain medications or sleeping medications (Benzodaizepines)  Do not take more than prescribed Pain, Sleep and Anxiety Medications. It is not advisable to combine anxiety,sleep and pain medications without talking with your primary care practitioner  Special Instructions: If you have smoked or chewed Tobacco  in the last 2 yrs please stop smoking, stop any regular Alcohol  and or any Recreational drug use.  Wear Seat belts while driving.  Please note: You were cared for by a hospitalist during your hospital stay. Once you are discharged, your primary care physician will handle any further medical issues. Please note that NO REFILLS for any discharge medications will be authorized once you are discharged, as it is imperative that you return to your primary care physician (or establish a relationship with a primary care physician if you do not have one) for your post hospital discharge needs so that they can reassess your need for medications and monitor your lab values.  Total Time spent coordinating discharge including counseling, education and face to face time equals greater than 30 minutes.  SignedKimberly Penna 12/27/2023 9:14 AM

## 2023-12-27 NOTE — Progress Notes (Signed)
 SLP Cancellation Note  Patient Details Name: Juan Palmer MRN: [id number] DOB: September 08, 1971   Cancelled treatment:        Orders for cognitive linguistic evaluation received and appreciated.  RN reports no deficits.  MRI negative.  Pt denies changes to speech, word finding, and cognition.  He politely declines assessment.  Evaluation deferred at this time.  SLP will sign off.    Elester Grim, MA, CCC-SLP Acute Rehabilitation Services Office: [phone] 12/27/2023, 11:33 AM

## 2023-12-27 NOTE — Progress Notes (Addendum)
 STROKE TEAM PROGRESS NOTE    INTERIM HISTORY/SUBJECTIVE  Patient sitting in the chair in no apparent distress.  No family at the bedside patient is anxious to go home  Per patient he states he had pain and numbness on his left pinky that ran up his forearm and he had numbness on his left leg as well that lasted for about 2 hours, completely resolved and has not happened since Saturday night  MRI normal, PA head and neck with no LVO Would recommend aspirin 81 mg and Plavix 75 mg for 3 weeks then aspirin alone.  Would also recommend starting Lipitor  OBJECTIVE  CBC    Component Value Date/Time   WBC 5.3 12/25/2023 2120   RBC 4.38 12/25/2023 2120   HGB 13.6 12/25/2023 2120   HGB 13.3 02/26/2023 1722   HGB 15.1 08/22/2010 0907   HCT 38.0 (L) 12/25/2023 2120   HCT 40.4 02/26/2023 1722   HCT 43.8 08/22/2010 0907   PLT 281 12/25/2023 2120   PLT 228 02/26/2023 1722   MCV 86.8 12/25/2023 2120   MCV 90 02/26/2023 1722   MCV 85.9 08/22/2010 0907   MCH 31.1 12/25/2023 2120   MCHC 35.8 12/25/2023 2120   RDW 12.3 12/25/2023 2120   RDW 13.2 02/26/2023 1722   RDW 13.5 08/22/2010 0907   LYMPHSABS 1.9 12/25/2023 2120   LYMPHSABS 1.8 08/25/2022 1226   LYMPHSABS 2.0 08/22/2010 0907   MONOABS 0.5 12/25/2023 2120   MONOABS 0.5 08/22/2010 0907   EOSABS 0.1 12/25/2023 2120   EOSABS 0.1 08/25/2022 1226   BASOSABS 0.0 12/25/2023 2120   BASOSABS 0.0 08/25/2022 1226   BASOSABS 0.0 08/22/2010 0907    BMET    Component Value Date/Time   NA 130 (L) 12/25/2023 2120   NA 133 (L) 02/26/2023 1722   K 3.8 12/25/2023 2120   CL 96 (L) 12/25/2023 2120   CO2 25 12/25/2023 2120   GLUCOSE 95 12/25/2023 2120   BUN 11 12/25/2023 2120   BUN 15 02/26/2023 1722   CREATININE 0.80 12/25/2023 2120   CALCIUM 8.6 (L) 12/25/2023 2120   EGFR 94 02/26/2023 1722   GFRNONAA >60 12/25/2023 2120    IMAGING past 24 hours CT ANGIO HEAD NECK W WO CM Result Date: 12/26/2023 CLINICAL DATA:  Initial evaluation for  acute TIA. EXAM: CT ANGIOGRAPHY HEAD AND NECK WITH AND WITHOUT CONTRAST TECHNIQUE: Multidetector CT imaging of the head and neck was performed using the standard protocol during bolus administration of intravenous contrast. Multiplanar CT image reconstructions and MIPs were obtained to evaluate the vascular anatomy. Carotid stenosis measurements (when applicable) are obtained utilizing NASCET criteria, using the distal internal carotid diameter as the denominator. RADIATION DOSE REDUCTION: This exam was performed according to the departmental dose-optimization program which includes automated exposure control, adjustment of the mA and/or kV according to patient size and/or use of iterative reconstruction technique. CONTRAST:  75mL OMNIPAQUE IOHEXOL 350 MG/ML SOLN COMPARISON:  None Available. FINDINGS: CT HEAD FINDINGS Brain: Cerebral volume within normal limits for patient age. No acute intracranial hemorrhage. No acute large vessel territory infarct. No mass lesion, midline shift, or mass effect. Ventricles are normal in size without hydrocephalus. No extra-axial fluid collection. Vascular: No abnormal hyperdense vessel. Skull: Scalp soft tissues demonstrate no acute abnormality. Calvarium intact. Sinuses/Orbits: Globes and orbital soft tissues within normal limits. Visualized paranasal sinuses are largely clear. No significant mastoid effusion. CTA NECK FINDINGS Aortic arch: Standard branching. Imaged portion shows no evidence of aneurysm or dissection. No significant  stenosis of the major arch vessel origins. Right carotid system: No evidence of dissection, stenosis (50% or greater), or occlusion. Left carotid system: No evidence of dissection, stenosis (50% or greater), or occlusion. Vertebral arteries: Codominant. No evidence of dissection, stenosis (50% or greater), Skeleton: No worrisome osseous lesions. Other neck: No other acute finding. Upper chest: No other acute finding. Review of the MIP images confirms  the above findings CTA HEAD FINDINGS Anterior circulation: Both internal carotid arteries are widely patent through the siphons without stenosis. Left A1 segment dominant and widely patent. Right A1 hypoplastic and/or absent, accounting for the diminutive right ICA is compared to the left. Normal anterior communicating artery complex. Anterior cerebral arteries patent without stenosis. No M1 stenosis or occlusion. Distal MCA branches perfused and symmetric. Posterior circulation: Both V4 segments patent without significant stenosis. Left vertebral artery dominant. Both PICA patent. Basilar widely patent without stenosis. Superior cerebral arteries patent bilaterally. Both PCAs primarily supplied via the basilar. PCAs are patent to their distal aspects without significant stenosis. Venous sinuses: Patent allowing for timing the contrast bolus. Anatomic variants: As above.  No aneurysm. Review of the MIP images confirms the above findings IMPRESSION: 1. Normal CTA of the head and neck. No large vessel occlusion, hemodynamically significant stenosis, or other acute vascular abnormality. 2. No other acute intracranial abnormality. Electronically Signed   By: Rise Mu M.D.   On: 12/26/2023 21:31   MR BRAIN WO CONTRAST Result Date: 12/26/2023 CLINICAL DATA:  Initial evaluation for acute TIA. EXAM: MRI HEAD WITHOUT CONTRAST TECHNIQUE: Multiplanar, multiecho pulse sequences of the brain and surrounding structures were obtained without intravenous contrast. COMPARISON:  CT from 12/25/2023. FINDINGS: Brain: Cerebral volume within normal limits for age. No focal parenchymal signal abnormality. No abnormal foci of restricted diffusion to suggest acute or subacute ischemia. Gray-white matter differentiation well maintained. No encephalomalacia to suggest chronic cortical infarction or other insult. No foci of susceptibility artifact indicative of acute or chronic intracranial blood products. No mass lesion,  midline shift or mass effect. Ventricles normal in size and morphology without hydrocephalus. No extra-axial fluid collection. Pituitary gland and suprasellar region within normal limits. Vascular: Major intracranial vascular flow voids are well maintained. Skull and upper cervical spine: Craniocervical junction within normal limits. Visualized upper cervical spine demonstrates no significant finding. Bone marrow signal intensity within normal limits. No scalp soft tissue abnormality. Sinuses/Orbits: Globes and orbital soft tissues are within normal limits. Paranasal sinuses are largely clear. No significant mastoid effusion. Other: None. IMPRESSION: Normal brain MRI. No acute intracranial abnormality identified. Electronically Signed   By: Rise Mu M.D.   On: 12/26/2023 21:18   ECHOCARDIOGRAM COMPLETE Result Date: 12/26/2023    ECHOCARDIOGRAM REPORT   Patient Name:   Ryken A Heo Date of Exam: 12/26/2023 Medical Rec #:  [id number]    Height:       74.0 in Accession #:    [id number]   Weight:       128.6 lb Date of Birth:  1971/05/22    BSA:          1.802 m Patient Age:    52 years     BP:           115/80 mmHg Patient Gender: M            HR:           54 bpm. Exam Location:  Inpatient Procedure: 2D Echo, Cardiac Doppler and Color Doppler (Both Spectral and Color  Flow Doppler were utilized during procedure). Indications:    TIA G45.9  History:        Patient has prior history of Echocardiogram examinations, most                 recent 12/18/2021. TIA.  Sonographer:    Terrilee Few RCS Referring Phys: [id number] KUBER GHIMIRE IMPRESSIONS  1. Left ventricular ejection fraction, by estimation, is 55 to 60%. The left ventricle has normal function. The left ventricle has no regional wall motion abnormalities. Left ventricular diastolic parameters were normal.  2. Right ventricular systolic function is normal. The right ventricular size is normal.  3. The mitral valve is grossly normal. Mild  mitral valve regurgitation. No evidence of mitral stenosis.  4. The aortic valve is tricuspid. Aortic valve regurgitation is not visualized. No aortic stenosis is present.  5. The inferior vena cava is normal in size with greater than 50% respiratory variability, suggesting right atrial pressure of 3 mmHg. Comparison(s): Prior images reviewed side by side. Similar function; Mild mitral regurgitation. FINDINGS  Left Ventricle: No strain or 3D transmitted. Left ventricular ejection fraction, by estimation, is 55 to 60%. The left ventricle has normal function. The left ventricle has no regional wall motion abnormalities. The left ventricular internal cavity size  was normal in size. There is no left ventricular hypertrophy. Left ventricular diastolic parameters were normal. Right Ventricle: The right ventricular size is normal. No increase in right ventricular wall thickness. Right ventricular systolic function is normal. Left Atrium: Left atrial size was normal in size. Right Atrium: Right atrial size was normal in size. Pericardium: There is no evidence of pericardial effusion. Mitral Valve: The mitral valve is grossly normal. Mild mitral valve regurgitation. No evidence of mitral valve stenosis. Tricuspid Valve: The tricuspid valve is normal in structure. Tricuspid valve regurgitation is not demonstrated. No evidence of tricuspid stenosis. Aortic Valve: The aortic valve is tricuspid. Aortic valve regurgitation is not visualized. No aortic stenosis is present. Aortic valve peak gradient measures 3.6 mmHg. Pulmonic Valve: The pulmonic valve was not well visualized. Pulmonic valve regurgitation is not visualized. No evidence of pulmonic stenosis. Aorta: The aortic root and ascending aorta are structurally normal, with no evidence of dilitation. Venous: The inferior vena cava is normal in size with greater than 50% respiratory variability, suggesting right atrial pressure of 3 mmHg. IAS/Shunts: The atrial septum is  grossly normal.  LEFT VENTRICLE PLAX 2D LVIDd:         3.20 cm   Diastology LVIDs:         2.30 cm   LV e' medial:    13.10 cm/s LV PW:         1.10 cm   LV E/e' medial:  4.4 LV IVS:        0.90 cm   LV e' lateral:   14.40 cm/s LVOT diam:     2.00 cm   LV E/e' lateral: 4.0 LV SV:         51 LV SV Index:   28 LVOT Area:     3.14 cm  RIGHT VENTRICLE             IVC RV S prime:     10.30 cm/s  IVC diam: 1.90 cm TAPSE (M-mode): 1.9 cm LEFT ATRIUM             Index        RIGHT ATRIUM           Index LA  diam:        2.40 cm 1.33 cm/m   RA Area:     11.70 cm LA Vol (A2C):   23.4 ml 12.99 ml/m  RA Volume:   23.70 ml  13.16 ml/m LA Vol (A4C):   31.0 ml 17.21 ml/m LA Biplane Vol: 27.8 ml 15.43 ml/m  AORTIC VALVE AV Area (Vmax): 2.77 cm AV Vmax:        95.00 cm/s AV Peak Grad:   3.6 mmHg LVOT Vmax:      83.70 cm/s LVOT Vmean:     47.600 cm/s LVOT VTI:       0.163 m  AORTA Ao Root diam: 3.30 cm Ao Asc diam:  2.90 cm MITRAL VALVE MV Area (PHT): 3.37 cm    SHUNTS MV Decel Time: 225 msec    Systemic VTI:  0.16 m MR Peak grad: 60.5 mmHg    Systemic Diam: 2.00 cm MR Vmax:      389.00 cm/s MV E velocity: 57.10 cm/s MV A velocity: 60.30 cm/s MV E/A ratio:  0.95 Riley Lam MD Electronically signed by Riley Lam MD Signature Date/Time: 12/26/2023/4:27:47 PM    Final     Vitals:   12/26/23 1940 12/27/23 0031 12/27/23 0513 12/27/23 0742  BP: (!) 113/57 107/71 (!) 106/58 109/70  Pulse:    (!) 53  Resp: 13 16 19 10   Temp: 97.9 F (36.6 C) (!) 97.3 F (36.3 C) 97.6 F (36.4 C) (!) 97.4 F (36.3 C)  TempSrc: Oral Oral Oral Oral  SpO2: 100% 98%    Weight:      Height:         PHYSICAL EXAM General:  Alert, well-nourished, well-developed patient in no acute distress Psych:  Mood and affect appropriate for situation CV: Regular rate and rhythm on monitor Respiratory:  Regular, unlabored respirations on room air GI: Abdomen soft and nontender   NEURO:  Mental Status: AA&Ox3, patient is  able to give clear and coherent history Speech/Language: speech is without dysarthria or aphasia.  Naming, repetition, fluency, and comprehension intact.  Cranial Nerves:  II: PERRL. Visual fields full.  III, IV, VI: EOMI. Eyelids elevate symmetrically.  V: Sensation is intact to light touch and symmetrical to face.  VII: Face is symmetrical resting and smiling VIII: hearing intact to voice. IX, X: Palate elevates symmetrically. Phonation is normal.  BJ:[id number] shrug 5/5. XII: tongue is midline without fasciculations. Motor: 5/5 strength to all muscle groups tested.  Tone: is normal and bulk is normal Sensation- Intact to light touch bilaterally. Extinction absent to light touch to DSS.   Coordination: FTN intact bilaterally, HKS: no ataxia in BLE.No drift.  Gait- deferred  Most Recent NIH 0   ASSESSMENT/PLAN  Mr. LOWRY BALA is a 53 y.o. male with history of chronic hepatitis B under surveillance, GERD, no other active issues presents to the emergency room with few hours of tingling and numbness of the left side   NIH on Admission 0  TIA vs. Left radiculopathy vs. Ulnar neuropathy   Patient complaining of left medial aspect of hand and forearm numbness, as well as lateral part of foreleg numbness.  Denies any pain, elbow pain, shoulder pain or neck pain.  Now symptom resolved.  He did have extensive physical work 1-2 days before the symptoms. CT head No acute abnormality. CTA head & neck no LVO MRI normal 2D Echo EF 55 to 60%. LDL 124 HgbA1c 5.2 UDS negative VTE prophylaxis -Lovenox No antithrombotic prior to  admission, now on aspirin 81 mg daily and clopidogrel 75 mg daily for 3 weeks and then aspirin alone. Therapy recommendations:  No follow up needed  Disposition: Home.  Follow-up with neurology to consider EMGs/NCS study if symptoms reoccurs.  Hyperlipidemia Home meds: None LDL 124, goal < 70 Add atorvastatin 40 mg Continue statin at discharge  Other Stroke Risk  Factors ETOH use, alcohol level <10, advised to drink no more than 2 drink(s) a day  Other Active Problems Hyponatremia, sodium 130 Chronic hepatitis, normal LFTs now  Hospital day # 0   Neurology will sign off. Please call with questions. Pt will follow up with stroke clinic NP at [facility] in about 4 weeks. Thanks for the consult.  Consuelo Denmark, MD PhD Stroke Neurology 12/27/2023 6:35 PM     To contact Stroke Continuity provider, please refer to [email or web address]. After hours, contact General Neurology

## 2023-12-27 NOTE — Evaluation (Signed)
 Physical Therapy Evaluation and D/C Patient Details Name: Juan Palmer MRN: [id number] DOB: 1971/09/06 Today's Date: 12/27/2023  History of Present Illness  53 year old admitted 4/12 presenting with transient left-sided paresthesias. Most likely a TIA as imaging negative. PMH: chronic hepatitis B  Clinical Impression  Pt admitted with above diagnosis.  Pt currently without functional limitations and is independent with all mobility to include stairs.  No further skilled PT needs.Will sign off.      If plan is discharge home, recommend the following:     Can travel by private vehicle        Equipment Recommendations None recommended by PT  Recommendations for Other Services       Functional Status Assessment Patient has not had a recent decline in their functional status     Precautions / Restrictions Precautions Precautions: None Restrictions Weight Bearing Restrictions Per Provider Order: No      Mobility  Bed Mobility Overal bed mobility: Independent                  Transfers Overall transfer level: Independent                      Ambulation/Gait Ambulation/Gait assistance: Independent Gait Distance (Feet): 450 Feet Assistive device: None Gait Pattern/deviations: WFL(Within Functional Limits)   Gait velocity interpretation: >2.62 ft/sec, indicative of community ambulatory   General Gait Details: No LOB with challenges.  Stairs Stairs: Yes Stairs assistance: Independent Stair Management: No rails, Alternating pattern, Forwards Number of Stairs: 12 General stair comments: No difficulty on stairs  Wheelchair Mobility     Tilt Bed    Modified Rankin (Stroke Patients Only) Modified Rankin (Stroke Patients Only) Pre-Morbid Rankin Score: No symptoms Modified Rankin: Slight disability     Balance Overall balance assessment: Independent                               Standardized Balance Assessment Standardized Balance  Assessment : Dynamic Gait Index   Dynamic Gait Index Level Surface: Normal Change in Gait Speed: Normal Gait with Horizontal Head Turns: Normal Gait with Vertical Head Turns: Normal Gait and Pivot Turn: Normal Step Over Obstacle: Normal Step Around Obstacles: Normal Steps: Normal Total Score: 24       Pertinent Vitals/Pain Pain Assessment Pain Assessment: No/denies pain    Home Living Family/patient expects to be discharged to:: Private residence Living Arrangements: Spouse/significant other Available Help at Discharge: Family;Available 24 hours/day Type of Home: House Home Access: Stairs to enter Entrance Stairs-Rails: Right;Left;Can reach both Entrance Stairs-Number of Steps: 3 Alternate Level Stairs-Number of Steps: 14 Home Layout: Two level Home Equipment: None      Prior Function Prior Level of Function : Independent/Modified Independent;Working/employed;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)                     Extremity/Trunk Assessment   Upper Extremity Assessment Upper Extremity Assessment: Overall WFL for tasks assessed    Lower Extremity Assessment Lower Extremity Assessment: Overall WFL for tasks assessed    Cervical / Trunk Assessment Cervical / Trunk Assessment: Normal  Communication   Communication Communication: No apparent difficulties    Cognition Arousal: Alert Behavior During Therapy: WFL for tasks assessed/performed   PT - Cognitive impairments: No apparent impairments  Following commands: Intact       Cueing       General Comments General comments (skin integrity, edema, etc.): Issued TIA handouts and discussed risk factors.    Exercises     Assessment/Plan    PT Assessment Patient does not need any further PT services  PT Problem List         PT Treatment Interventions      PT Goals (Current goals can be found in the Care Plan section)  Acute Rehab PT  Goals Patient Stated Goal: wants to go home today PT Goal Formulation: All assessment and education complete, DC therapy    Frequency       Co-evaluation               AM-PAC PT "6 Clicks" Mobility  Outcome Measure Help needed turning from your back to your side while in a flat bed without using bedrails?: None Help needed moving from lying on your back to sitting on the side of a flat bed without using bedrails?: None Help needed moving to and from a bed to a chair (including a wheelchair)?: None Help needed standing up from a chair using your arms (e.g., wheelchair or bedside chair)?: None Help needed to walk in hospital room?: None Help needed climbing 3-5 steps with a railing? : None 6 Click Score: 24    End of Session Equipment Utilized During Treatment: Gait belt Activity Tolerance: Patient tolerated treatment well Patient left: in chair;with call bell/phone within reach Nurse Communication: Mobility status PT Visit Diagnosis: Muscle weakness (generalized) (M62.81)    Time: 4540-9811 PT Time Calculation (min) (ACUTE ONLY): 20 min   Charges:   PT Evaluation $PT Eval Low Complexity: 1 Low   PT General Charges $$ ACUTE PT VISIT: 1 Visit         Cambria Osten M,PT Acute Rehab Services [phone]   Florencia Hunter 12/27/2023, 10:19 AM

## 2023-12-27 NOTE — Progress Notes (Signed)
 OT Screen Note  Patient Details Name: ASHKAN CHAMBERLAND MRN: [id number] DOB: 1971/05/30   Cancelled Treatment:    Reason Eval/Treat Not Completed: OT screened, no needs identified, will sign off Per PT, patient with no functional deficits and does not require OT evaluation. OT will sign off at this time.   Mollie Anger E. Onita Pfluger, OTR/L Acute Rehabilitation Services [phone]   Vincent Greek 12/27/2023, 10:33 AM

## 2024-01-21 ENCOUNTER — Other Ambulatory Visit: Payer: Self-pay

## 2024-01-21 DIAGNOSIS — R1013 Epigastric pain: Secondary | ICD-10-CM

## 2024-01-26 ENCOUNTER — Ambulatory Visit (HOSPITAL_COMMUNITY)
Admission: RE | Admit: 2024-01-26 | Discharge: 2024-01-26 | Disposition: A | Discharge status: Home / Self Care | Source: Ambulatory Visit | Attending: Nurse Practitioner | Admitting: Nurse Practitioner

## 2024-01-26 ENCOUNTER — Ambulatory Visit: Payer: Self-pay | Admitting: Nurse Practitioner

## 2024-01-26 DIAGNOSIS — R1013 Epigastric pain: Secondary | ICD-10-CM | POA: Diagnosis present

## 2024-02-02 ENCOUNTER — Ambulatory Visit (HOSPITAL_COMMUNITY)

## 2024-02-11 ENCOUNTER — Other Ambulatory Visit: Payer: Self-pay | Admitting: Family Medicine

## 2024-02-11 DIAGNOSIS — M25561 Pain in right knee: Secondary | ICD-10-CM

## 2024-03-03 ENCOUNTER — Other Ambulatory Visit: Payer: Self-pay | Admitting: Family Medicine

## 2024-03-03 DIAGNOSIS — K219 Gastro-esophageal reflux disease without esophagitis: Secondary | ICD-10-CM

## 2024-03-13 ENCOUNTER — Ambulatory Visit: Admitting: Infectious Diseases

## 2024-03-14 ENCOUNTER — Ambulatory Visit: Admitting: Infectious Diseases

## 2024-03-16 ENCOUNTER — Other Ambulatory Visit: Payer: Self-pay

## 2024-03-16 ENCOUNTER — Emergency Department (HOSPITAL_BASED_OUTPATIENT_CLINIC_OR_DEPARTMENT_OTHER)
Admission: EM | Admit: 2024-03-16 | Discharge: 2024-03-16 | Disposition: A | Discharge status: Home / Self Care | Attending: Emergency Medicine | Admitting: Emergency Medicine

## 2024-03-16 ENCOUNTER — Encounter (HOSPITAL_BASED_OUTPATIENT_CLINIC_OR_DEPARTMENT_OTHER): Payer: Self-pay | Admitting: Emergency Medicine

## 2024-03-16 DIAGNOSIS — J029 Acute pharyngitis, unspecified: Secondary | ICD-10-CM | POA: Diagnosis present

## 2024-03-16 DIAGNOSIS — Z7982 Long term (current) use of aspirin: Secondary | ICD-10-CM | POA: Insufficient documentation

## 2024-03-16 DIAGNOSIS — Z7901 Long term (current) use of anticoagulants: Secondary | ICD-10-CM | POA: Insufficient documentation

## 2024-03-16 DIAGNOSIS — R1310 Dysphagia, unspecified: Secondary | ICD-10-CM | POA: Diagnosis not present

## 2024-03-16 LAB — RESP PANEL BY RT-PCR (RSV, FLU A&B, COVID)  RVPGX2
Influenza A by PCR: NEGATIVE
Influenza B by PCR: NEGATIVE
Resp Syncytial Virus by PCR: NEGATIVE
SARS Coronavirus 2 by RT PCR: NEGATIVE

## 2024-03-16 LAB — GROUP A STREP BY PCR: Group A Strep by PCR: NOT DETECTED

## 2024-03-16 MED ORDER — FAMOTIDINE 20 MG PO TABS: 20.0000 mg | ORAL_TABLET | Freq: Two times a day (BID) | ORAL | 0 refills | Status: DC

## 2024-03-16 MED ORDER — SUCRALFATE 1 G PO TABS: 1.0000 g | ORAL_TABLET | Freq: Three times a day (TID) | ORAL | 0 refills | Status: AC

## 2024-03-16 NOTE — ED Provider Notes (Signed)
  EMERGENCY DEPARTMENT AT [facility] Provider Note   CSN: [id number] Arrival date & time: 03/16/24  9189     Patient presents with: Sore Throat   Juan Palmer is a 53 y.o. male.   Patient with history admission 12/2023 for TIA, Plavix , ENT visit end of 12/2023 for sore throat/dysphonia diagnosed with reflux --presents to the emergency department for evaluation of left-sided sore throat and pain with swallowing ongoing for greater than 3 weeks.  Pain is on the left side of the throat and radiates to the left ear.  He feels that this is different than when he saw his ENT in April.  No hearing change or muffled hearing.  No facial pain or pressure.  No dental pain.  No pain with movement of the neck.  Pain is worse with swallowing.  No difficulty with breathing.  He states that he takes his acid blocking medicine as needed.       Prior to Admission medications   Medication Sig Start Date End Date Taking? Authorizing Provider  famotidine  (PEPCID ) 20 MG tablet Take 1 tablet (20 mg total) by mouth 2 (two) times daily. 03/16/24  Yes Clay Menser, PA-C  sucralfate  (CARAFATE ) 1 g tablet Take 1 tablet (1 g total) by mouth 4 (four) times daily -  with meals and at bedtime. 03/16/24  Yes Desiderio Chew, PA-C  aspirin  EC 81 MG tablet Take 1 tablet (81 mg total) by mouth daily. Swallow whole. 12/28/23   Ghimire, Donalda HERO, MD  atorvastatin  (LIPITOR) 40 MG tablet Take 1 tablet (40 mg total) by mouth daily. 12/28/23   Ghimire, Donalda HERO, MD  Blood Pressure KIT 1 application  by Does not apply route as needed. 06/02/22   Austin Ade, MD  clopidogrel  (PLAVIX ) 75 MG tablet Take 1 tablet (75 mg total) by mouth daily. 12/28/23   Ghimire, Donalda HERO, MD  diclofenac  Sodium (VOLTAREN ) 1 % GEL APPLY 2 GRAMS TO AFFECTED AREA 4 TIMES A DAY 02/11/24   Romelle Booty, MD  nitroGLYCERIN  (NITROSTAT ) 0.3 MG SL tablet Place 1 tablet (0.3 mg total) under the tongue every 5 (five) minutes as needed for chest  pain. 10/09/22   Austin Ade, MD  OPZELURA 1.5 % CREA Apply 1 Application topically 2 (two) times daily. 08/30/23   [provider]  pantoprazole  (PROTONIX ) 40 MG tablet TAKE 1 TABLET BY MOUTH EVERY DAY 03/03/24   Romelle Booty, MD    Allergies: Patient has no known allergies.    Review of Systems  Updated Vital Signs BP (!) 101/58 (BP Location: Left Arm)   Pulse 62   Temp 97.9 F (36.6 C) (Oral)   Resp 18   Ht 6' 2 (1.88 m)   Wt 57.6 kg   SpO2 99%   BMI 16.31 kg/m   Physical Exam Vitals and nursing note reviewed.  Constitutional:      General: He is not in acute distress.    Appearance: He is well-developed.  HENT:     Head: Normocephalic and atraumatic.     Jaw: No trismus.     Right Ear: Tympanic membrane, ear canal and external ear normal.     Left Ear: Tympanic membrane, ear canal and external ear normal.     Ears:     Comments: No cerumen impaction, TMs are normal without bulging or retraction.    Nose: Nose normal. No mucosal edema or rhinorrhea.     Mouth/Throat:     Mouth: Mucous membranes are  not dry.     Pharynx: Uvula midline. No oropharyngeal exudate, posterior oropharyngeal erythema or uvula swelling.     Tonsils: No tonsillar abscesses.  Eyes:     General:        Right eye: No discharge.        Left eye: No discharge.     Conjunctiva/sclera: Conjunctivae normal.     Comments: Small pterygium noted left eye  Neck:     Thyroid: No thyroid mass.     Trachea: Trachea normal.     Comments: Patient with a few nontender, nonenlarged submandibular lymph nodes Cardiovascular:     Rate and Rhythm: Normal rate and regular rhythm.     Heart sounds: Normal heart sounds.  Pulmonary:     Effort: Pulmonary effort is normal. No respiratory distress.     Breath sounds: Normal breath sounds. No wheezing or rales.  Abdominal:     Palpations: Abdomen is soft.     Tenderness: There is no abdominal tenderness.  Musculoskeletal:     Cervical back: Normal  range of motion and neck supple. No edema or erythema. No pain with movement. Normal range of motion.  Lymphadenopathy:     Cervical:     Right cervical: No superficial, deep or posterior cervical adenopathy.    Left cervical: No superficial, deep or posterior cervical adenopathy.  Skin:    General: Skin is warm and dry.  Neurological:     Mental Status: He is alert.     (all labs ordered are listed, but only abnormal results are displayed) Labs Reviewed  RESP PANEL BY RT-PCR (RSV, FLU A&B, COVID)  [id number]  GROUP A STREP BY PCR    EKG: None  Radiology: No results found.   Procedures   Medications Ordered in the ED - No data to display  ED course  Patient seen and examined. History obtained directly from patient. Work-up including labs, imaging, EKG ordered in triage, if performed, were reviewed.    Labs/EKG: Independently reviewed and interpreted.  This included: Strep negative, flu, COVID, RSV testing negative.  Imaging: None ordered  Medications/Fluids: None ordered  Most recent vital signs reviewed and are as follows: BP (!) 101/58 (BP Location: Left Arm)   Pulse 62   Temp 97.9 F (36.6 C) (Oral)   Resp 18   Ht 6' 2 (1.88 m)   Wt 57.6 kg   SpO2 99%   BMI 16.31 kg/m   Initial impression: Sore throat and painful swallowing  Home treatment plan: Maximize reflux medication at this point, patient encouraged to take Protonix  daily, will add on Pepcid  for 1 week, and Carafate .  Return instructions discussed with patient: New or worsening symptoms, difficulty breathing or swallowing, fever  Follow-up instructions discussed with patient: ENT if not improved in 2 weeks                                  Medical Decision Making Risk Prescription drug management.   In regards to the patient's sore throat today, the following dangerous and potentially life threatening etiologies were considered on the differential diagnosis: Lugwig's angina, uvulitis,  epiglottis, peritonsillar abscess, retropharyngeal abscess, Lemierre's syndrome. Also considered were more common causes such as: streptococcal pharyngitis, gonococcal pharyngitis, non-bacterial pharyngitis (cold viruses, HSV/coxsackievirus, influenza, COVID-19, infectious mononucleosis, oropharyngeal candidiasis), and other non-infectious causes including seasonal allergies/post-nasal drip, GERD/esophagitis, trauma.   Patient has a normal exam today.  Full range of  motion of neck.  Swallowing normally and tolerating secretions here.  Posterior oropharynx is clear.  Given previous history, symptoms may be reflux related.  Will maximize therapy.  Currently not taking PPI appropriately.  If not improving with this, would likely benefit from repeat ENT follow-up.  The patient's vital signs, pertinent lab work and imaging were reviewed and interpreted as discussed in the ED course. Hospitalization was considered for further testing, treatments, or serial exams/observation. However as patient is well-appearing, has a stable exam, and reassuring studies today, I do not feel that they warrant admission at this time. This plan was discussed with the patient who verbalizes agreement and comfort with this plan and seems reliable and able to return to the Emergency Department with worsening or changing symptoms.       Final diagnoses:  Sore throat  Odynophagia    ED Discharge Orders          Ordered    famotidine  (PEPCID ) 20 MG tablet  2 times daily        03/16/24 1018    sucralfate  (CARAFATE ) 1 g tablet  3 times daily with meals & bedtime        03/16/24 1018               Desiderio Chew, PA-C 03/16/24 1027    Freddi Hamilton, MD 03/16/24 1448

## 2024-03-16 NOTE — Discharge Instructions (Signed)
 Your strep test and COVID/flu test were negative.  Please make sure that you are taking the pantoprazole  daily.  Take the Pepcid  for 1 week and take the Carafate  as well.  If you do not have improvement in the next 2 weeks, please revisit your ENT for further evaluation.  Return if you are unable to swallow, have severe pain, fevers, difficulty moving your neck, or other concerns.

## 2024-03-16 NOTE — ED Triage Notes (Signed)
 Sore throat radiating to left ear for 3 weeks.  No known fever.  Painful swallowing.  Some runny nose and cough.  No known rash.

## 2024-03-24 ENCOUNTER — Ambulatory Visit (INDEPENDENT_AMBULATORY_CARE_PROVIDER_SITE_OTHER): Admitting: Family Medicine

## 2024-03-24 ENCOUNTER — Encounter: Payer: Self-pay | Admitting: Family Medicine

## 2024-03-24 VITALS — BP 80/51 | HR 65 | Ht 74.0 in | Wt 121.0 lb

## 2024-03-24 DIAGNOSIS — I959 Hypotension, unspecified: Secondary | ICD-10-CM | POA: Diagnosis not present

## 2024-03-24 DIAGNOSIS — E782 Mixed hyperlipidemia: Secondary | ICD-10-CM

## 2024-03-24 DIAGNOSIS — Z0000 Encounter for general adult medical examination without abnormal findings: Secondary | ICD-10-CM | POA: Diagnosis not present

## 2024-03-24 DIAGNOSIS — Z23 Encounter for immunization: Secondary | ICD-10-CM

## 2024-03-24 MED ORDER — SHINGRIX 50 MCG/0.5ML IM SUSR: INTRAMUSCULAR | 1 refills | Status: AC

## 2024-03-24 MED ORDER — ATORVASTATIN CALCIUM 40 MG PO TABS: 40.0000 mg | ORAL_TABLET | Freq: Every day | ORAL | 1 refills | Status: DC

## 2024-03-24 NOTE — Patient Instructions (Addendum)
 I will let you know if any of your results from today are abnormal or require treatment  Please seek medical attention if you develop any symptoms of low blood pressure such as dizziness, lightheadedness, palpitations, chest pain, shortness of breath, or passing out  Please visit your pharmacy to get your shingles vaccine

## 2024-03-24 NOTE — Progress Notes (Signed)
    SUBJECTIVE:   Chief compliant/HPI: annual examination  Juan Palmer is a 53 y.o. who presents today for an annual exam.   Concerns: Patient reports for the past 2 days he was unable to eat or drink much because of his GI issues.  However, he used laxatives and this helped.  Today he feels much better and has been able to drink and eat.  He is urinating normally.  Denies any lightheadedness, dizziness, passing out, palpitations, shortness of breath. Urinating normally.  Has not been taking lipitor, aspirin .  Taking protonix   Denies taking any nitroglycerin  recently  Otherwise, feels normal and denies concerns  Completed [place] earlier this year  OBJECTIVE:   BP (!) 80/51   Pulse 65   Ht 6' 2 (1.88 m)   Wt 121 lb (54.9 kg)   SpO2 97%   BMI 15.54 kg/m   General: NAD, pleasant, able to participate in exam Cardiac: RRR, no murmurs auscultated Respiratory: CTAB, normal WOB Abdomen: soft, non-tender, non-distended, normoactive bowel sounds Extremities: warm and well perfused, no edema or cyanosis Skin: warm and dry, vitiligo noted Neuro: alert, no obvious focal deficits, speech normal Psych: Normal affect and mood  ASSESSMENT/PLAN:   Assessment & Plan Annual physical exam See below BMP, lipid panel today Discussed he should be taking aspirin  and Lipitor per neuro recs Provided shingles vaccine Rx Hypotension, unspecified hypotension type Hypotensive today in clinic today 80/51 on recheck.  Possibly in the setting of recent decreased p.o. intake Patient not on any antihypertensives On chart review has had soft BP in the past Currently asymptomatic and appears to have good perfusion in extremities Will check BMP to ensure creatinine is stable Otherwise routine follow-up, discussed return precautions should he develop symptoms   Annual Examination  See AVS for age appropriate recommendations.     Considered the following screening exams based upon  USPSTF recommendations: Diabetes screening: Unremarkable in April Screening for elevated cholesterol: Unremarkable in April Colorectal cancer screening: -- pt reports normal colonoscopy in 2021 done in [place]  Lung cancer screening: Not indicated  Follow up in 1 year or sooner if indicated.  MyChart Activation: Already signed up   Payton Coward, MD [facility]

## 2024-03-25 LAB — BASIC METABOLIC PANEL WITH GFR
BUN/Creatinine Ratio: 10 (ref 9–20)
BUN: 10 mg/dL (ref 6–24)
CO2: 21 mmol/L (ref 20–29)
Calcium: 8.8 mg/dL (ref 8.7–10.2)
Chloride: 94 mmol/L — ABNORMAL LOW (ref 96–106)
Creatinine, Ser: 1.03 mg/dL (ref 0.76–1.27)
Glucose: 75 mg/dL (ref 70–99)
Potassium: 4.2 mmol/L (ref 3.5–5.2)
Sodium: 130 mmol/L — ABNORMAL LOW (ref 134–144)
eGFR: 88 mL/min/1.73 (ref >59)

## 2024-03-25 LAB — LIPID PANEL
Chol/HDL Ratio: 2.8 ratio (ref 0.0–5.0)
Cholesterol, Total: 143 mg/dL (ref 100–199)
HDL: 51 mg/dL (ref >39)
LDL Chol Calc (NIH): 81 mg/dL (ref 0–99)
Triglycerides: 49 mg/dL (ref 0–149)
VLDL Cholesterol Cal: 11 mg/dL (ref 5–40)

## 2024-03-28 ENCOUNTER — Other Ambulatory Visit: Payer: Self-pay

## 2024-03-28 ENCOUNTER — Ambulatory Visit: Payer: Self-pay | Admitting: Family Medicine

## 2024-03-28 ENCOUNTER — Ambulatory Visit: Admitting: Infectious Diseases

## 2024-03-28 VITALS — BP 97/57 | HR 60 | Temp 97.8°F | Ht 74.0 in | Wt 123.0 lb

## 2024-03-28 DIAGNOSIS — E43 Unspecified severe protein-calorie malnutrition: Secondary | ICD-10-CM

## 2024-03-28 DIAGNOSIS — E46 Unspecified protein-calorie malnutrition: Secondary | ICD-10-CM

## 2024-03-28 DIAGNOSIS — B181 Chronic viral hepatitis B without delta-agent: Secondary | ICD-10-CM | POA: Diagnosis present

## 2024-03-28 NOTE — Progress Notes (Incomplete)
 [facility]                                      [address], [place], [place], [address]                                               Phn. [phone]; Fax: [phone]                                                               Date:  03/28/24  Reason for Visit: Hepatitis B return to care    HPI: Juan Palmer is a 53 y.o.old male, Originally from [place] with h/o prediabetes, TIA, GERD  who is here for follow up for chronic hepatitis B after being lost to fu since last seen in March 2023. He was seen in the ED several times since last seen as well as recently admitted in April 2025 for TIA like symptoms.   Reports going to [place] during the 1st 2 weeks of June for religious purpose and lost 6 lbs weight. Seen by PCP pn 7/11. Otherwise, no new concerns. Discussed plan for labs today and fu to review labs.   ROS: Denies yellowish discoloration of sclera and skin, abdominal pain/distension, hematemesis.            Denies fever, chills, nightsweats, nausea, vomiting, diarrhea, constipation, appetite/weight loss, rashes, joint complaints, shortness of breath, cough, chest pain, headaches, GU symptoms.  Current Outpatient Medications on File Prior to Visit  Medication Sig Dispense Refill   aspirin  EC 81 MG tablet Take 1 tablet (81 mg total) by mouth daily. Swallow whole. 30 tablet 12   atorvastatin  (LIPITOR) 40 MG tablet Take 1 tablet (40 mg total) by mouth daily. 30 tablet 1   Blood Pressure KIT 1 application  by Does not apply route as needed. 1 kit 0   clopidogrel  (PLAVIX ) 75 MG tablet Take 1 tablet (75 mg total) by mouth daily. 21 tablet 0   diclofenac  Sodium (VOLTAREN ) 1 % GEL APPLY 2 GRAMS TO AFFECTED AREA 4 TIMES A DAY 100 g 1   famotidine  (PEPCID ) 20 MG tablet Take 1 tablet (20 mg total) by mouth 2 (two) times daily. 15 tablet 0   nitroGLYCERIN  (NITROSTAT ) 0.3 MG SL tablet Place 1 tablet (0.3 mg  total) under the tongue every 5 (five) minutes as needed for chest pain. 5 tablet 0   OPZELURA 1.5 % CREA Apply 1 Application topically 2 (two) times daily.     pantoprazole  (PROTONIX ) 40 MG tablet TAKE 1 TABLET BY MOUTH EVERY DAY 90 tablet 1   sucralfate  (CARAFATE ) 1 g tablet Take 1 tablet (1 g total) by mouth 4 (four) times daily -  with meals and at bedtime. 60 tablet 0   Zoster Vaccine Adjuvanted (SHINGRIX ) injection Administer Shingrix  vaccination now and repeat in two months 1 each 1   No current facility-administered medications on file prior to visit.   No Known Allergies  Past Medical History:  Diagnosis Date   Constipation 10/23/2022  Hepatitis A immune 11/26/2021   Past Surgical History:  Procedure Laterality Date   COLONOSCOPY     UPPER GASTROINTESTINAL ENDOSCOPY     Social History   Socioeconomic History   Marital status: Married    Spouse name: Not on file   Number of children: 3   Years of education: Not on file   Highest education level: Not on file  Occupational History   Occupation: self employed  Tobacco Use   Smoking status: Never   Smokeless tobacco: Never  Vaping Use   Vaping status: Never Used  Substance and Sexual Activity   Alcohol use: No   Drug use: No   Sexual activity: Not on file  Other Topics Concern   Not on file  Social History Narrative   Not on file   Social Drivers of Health   Financial Resource Strain: Not on file  Food Insecurity: No Food Insecurity (12/26/2023)   Hunger Vital Sign    Worried About Running Out of Food in the Last Year: Never true    Ran Out of Food in the Last Year: Never true  Transportation Needs: No Transportation Needs (12/26/2023)   PRAPARE - Administrator, Civil Service (Medical): No    Lack of Transportation (Non-Medical): No  Physical Activity: Not on file  Stress: Not on file  Social Connections: Socially Integrated (12/26/2023)   Social Connection and Isolation Panel    Frequency of  Communication with Friends and Family: More than three times a week    Frequency of Social Gatherings with Friends and Family: Three times a week    Attends Religious Services: More than 4 times per year    Active Member of Clubs or Organizations: Yes    Attends Banker Meetings: More than 4 times per year    Marital Status: Married  Catering manager Violence: Not At Risk (12/26/2023)   Humiliation, Afraid, Rape, and Kick questionnaire    Fear of Current or Ex-Partner: No    Emotionally Abused: No    Physically Abused: No    Sexually Abused: No   Family History  Problem Relation Age of Onset   Healthy Mother    Healthy Father    Colon cancer Neg Hx    Stomach cancer Neg Hx    Esophageal cancer Neg Hx    Rectal cancer Neg Hx    Vitals  BP (!) 97/57   Pulse 60   Temp 97.8 F (36.6 C) (Temporal)   Ht 6' 2 (1.88 m)   Wt 123 lb (55.8 kg)   SpO2 98%   BMI 15.79 kg/m   Gen: no acute distress, thin appearing HEENT: [place]/AT, no scleral icterus, no pale conjunctivae, hearing normal, oral mucosa moist Neck: Supple Cardio: Regular rate and rhythm Resp: CTAB GI: nondistended GU: Musc: Extremities: No pedal edema Skin: No rashes Neuro: Grossly non focal, awake, alert and oriented * 3 Psych: Calm, cooperative  Laboratory     Latest Ref Rng & Units 12/25/2023    9:20 PM 09/30/2023    3:07 PM 09/03/2023    9:05 AM  CBC  WBC 4.0 - 10.5 K/uL 5.3  4.1  4.3   Hemoglobin 13.0 - 17.0 g/dL 86.3  86.2  86.1   Hematocrit 39.0 - 52.0 % 38.0  40.6  40.3   Platelets 150 - 400 K/uL 281  261.0  246       Latest Ref Rng & Units 03/28/2024    1:52 PM  03/24/2024   10:12 AM 12/25/2023    9:20 PM  CMP  Glucose 70 - 99 mg/dL  75  95   BUN 6 - 24 mg/dL  10  11   Creatinine 9.23 - 1.27 mg/dL  8.96  9.19   Sodium 865 - 144 mmol/L  130  130   Potassium 3.5 - 5.2 mmol/L  4.2  3.8   Chloride 96 - 106 mmol/L  94  96   CO2 20 - 29 mmol/L  21  25   Calcium  8.7 - 10.2 mg/dL  8.8  8.6    Total Protein 6.1 - 8.1 g/dL 6.9   7.2   Total Bilirubin 0.2 - 1.2 mg/dL 0.7   0.6   Alkaline Phos 38 - 126 U/L   49   AST 10 - 35 U/L 22   25   ALT 9 - 46 U/L 17   20    Imaging  01/26/24 RUQ US  FINDINGS: Gallbladder:   No gallstones or wall thickening visualized. Trace sludge. No sonographic Murphy sign noted by sonographer.   Common bile duct:   Diameter: Visualized portion measures 2 mm, within normal limits. Majority isn't visualized.   Liver:   No focal lesion identified. Within normal limits in parenchymal echogenicity. Portal vein is patent on color Doppler imaging with normal direction of blood flow towards the liver.   Other: None.   IMPRESSION: No sonographic etiology for epigastric pain is identified.  Assessment/Plan: # Chronic Hepatitis B - Labs today  - US  RUQ in May 2025 with no focal lesion or cirrhosis - encouraged compliance with follow ups - fu in 10-14 days to review labs and +/- tx  # Malnutrition  - appetite normal  - fu with PCP, age based ca screening   I spent 30 minutes involved in face-to-face and non-face-to-face activities for this patient on the day of the visit. Professional time spent includes the following activities: Preparing to see the patient (review of tests), Obtaining and reviewing separately obtained history (several ED visits since last seen, discharge record in April 2025, PCP notes on 7/11), Performing a medically appropriate examination and evaluation , Ordering labs, Documenting clinical information in the EMR, Independently interpreting results (not separately reported), Communicating results to the patient, Counseling and educating the patient.  Patients questions were addressed and answered.   Of note, portions of this note may have been created with voice recognition software. While this note has been edited for accuracy, occasional wrong-word or 'sound-a-like' substitutions may have occurred due to the inherent  limitations of voice recognition software.   Electronically signed by:  Annalee Orem, MD Infectious Diseases  Office phone [phone] Fax no. [phone]

## 2024-03-29 DIAGNOSIS — E43 Unspecified severe protein-calorie malnutrition: Secondary | ICD-10-CM | POA: Insufficient documentation

## 2024-04-05 LAB — HEPATIC FUNCTION PANEL
AG Ratio: 1.5 (calc) (ref 1.0–2.5)
ALT: 17 U/L (ref 9–46)
AST: 22 U/L (ref 10–35)
Albumin: 4.1 g/dL (ref 3.6–5.1)
Alkaline phosphatase (APISO): 51 U/L (ref 35–144)
Bilirubin, Direct: 0.2 mg/dL (ref 0.0–0.2)
Globulin: 2.8 g/dL (ref 1.9–3.7)
Indirect Bilirubin: 0.5 mg/dL (ref 0.2–1.2)
Total Bilirubin: 0.7 mg/dL (ref 0.2–1.2)
Total Protein: 6.9 g/dL (ref 6.1–8.1)

## 2024-04-05 LAB — LIVER FIBROSIS, FIBROTEST-ACTITEST
ALT: 15 U/L (ref 9–46)
Alpha-2-Macroglobulin: 135 mg/dL (ref 106–279)
Apolipoprotein A1: 122 mg/dL (ref 94–176)
Bilirubin: 0.7 mg/dL (ref 0.2–1.2)
Fibrosis Score: 0.17
GGT: 32 U/L (ref 3–95)
Haptoglobin: 186 mg/dL (ref 43–212)
Necroinflammat ACT Score: 0.04
Reference ID: 5598245

## 2024-04-05 LAB — HEPATITIS B SURFACE ANTIGEN: Hepatitis B Surface Ag: REACTIVE — AB

## 2024-04-05 LAB — HEPATITIS B DNA, ULTRAQUANTITATIVE, PCR
Hepatitis B DNA: 66 [IU]/mL — ABNORMAL HIGH
Hepatitis B virus DNA: 1.82 {Log_IU}/mL — ABNORMAL HIGH

## 2024-04-05 LAB — HEPATITIS B E ANTIBODY: Hep B E Ab: REACTIVE — AB

## 2024-04-05 LAB — HEPATITIS B E ANTIGEN: Hep B E Ag: NONREACTIVE

## 2024-04-11 ENCOUNTER — Ambulatory Visit: Admitting: Infectious Diseases

## 2024-04-11 ENCOUNTER — Other Ambulatory Visit: Payer: Self-pay

## 2024-04-11 ENCOUNTER — Ambulatory Visit: Payer: Self-pay | Admitting: Infectious Diseases

## 2024-04-11 ENCOUNTER — Encounter: Payer: Self-pay | Admitting: Infectious Diseases

## 2024-04-11 VITALS — BP 102/65 | HR 63 | Temp 98.8°F | Wt 123.2 lb

## 2024-04-11 DIAGNOSIS — Z712 Person consulting for explanation of examination or test findings: Secondary | ICD-10-CM | POA: Diagnosis not present

## 2024-04-11 DIAGNOSIS — B181 Chronic viral hepatitis B without delta-agent: Secondary | ICD-10-CM

## 2024-04-11 DIAGNOSIS — Z129 Encounter for screening for malignant neoplasm, site unspecified: Secondary | ICD-10-CM | POA: Insufficient documentation

## 2024-04-11 DIAGNOSIS — Z8619 Personal history of other infectious and parasitic diseases: Secondary | ICD-10-CM | POA: Diagnosis not present

## 2024-04-11 NOTE — Progress Notes (Signed)
 [facility]                                      [address], [place], [place], [address]                                               Phn. [phone]; Fax: [phone]                                                                     Date:  04/11/24  Reason for Visit: Hepatitis B fu, review results    HPI: Juan Palmer is a 53 y.o.old male, Originally from [place] with h/o prediabetes, TIA, GERD, h/o H pylori s/p tx with recurrence in 12/2021 s/p successful tx per chart in  who is here for follow up for chronic hepatitis B after being lost to fu since last seen in March 2023. He was seen in the ED several times since last seen as well as recently admitted in April 2025 for TIA like symptoms.   Reports going to [place] during the 1st 2 weeks of June for religious purpose and lost 6 lbs weight. Seen by PCP pn 7/11. Otherwise, no new concerns. Discussed plan for labs today and fu to review labs.   7/29 Discuss lab results. No changes from last visit. He says one of his prior doctor from outside [place]  told to avoid eggs and he has since stopped eating eggs since 2001, however likes to eat eggs/omelette and eats once in 3 months or so. Told unclear reason for why the other doctor told to avoid eggs but said that the yellow portion in egg with lot of cholesterol and avoided in patients with high cholesterol. He does not have restrictions for eggs from HBV standpoint but can discuss with his PCP if restrictions due to other conditions. Discussed to avoid alcohol, hepatotoxins. Etc. No other complaints.   ROS: all systems reviewed with pertinent positives and negatives as listed above  Current Outpatient Medications on File Prior to Visit  Medication Sig Dispense Refill   aspirin  EC 81 MG tablet Take 1 tablet (81 mg total) by mouth daily. Swallow whole. 30 tablet 12   atorvastatin  (LIPITOR) 40 MG tablet Take 1  tablet (40 mg total) by mouth daily. 30 tablet 1   Blood Pressure KIT 1 application  by Does not apply route as needed. 1 kit 0   clopidogrel  (PLAVIX ) 75 MG tablet Take 1 tablet (75 mg total) by mouth daily. 21 tablet 0   diclofenac  Sodium (VOLTAREN ) 1 % GEL APPLY 2 GRAMS TO AFFECTED AREA 4 TIMES A DAY 100 g 1   famotidine  (PEPCID ) 20 MG tablet Take 1 tablet (20 mg total) by mouth 2 (two) times daily. 15 tablet 0   nitroGLYCERIN  (NITROSTAT ) 0.3 MG SL tablet Place 1 tablet (0.3 mg total) under the tongue every 5 (five) minutes as needed for chest pain. 5 tablet 0   OPZELURA 1.5 % CREA Apply 1 Application topically 2 (two)  times daily.     pantoprazole  (PROTONIX ) 40 MG tablet TAKE 1 TABLET BY MOUTH EVERY DAY 90 tablet 1   sucralfate  (CARAFATE ) 1 g tablet Take 1 tablet (1 g total) by mouth 4 (four) times daily -  with meals and at bedtime. 60 tablet 0   Zoster Vaccine Adjuvanted (SHINGRIX ) injection Administer Shingrix  vaccination now and repeat in two months 1 each 1   No current facility-administered medications on file prior to visit.   No Known Allergies  Past Medical History:  Diagnosis Date   Constipation 10/23/2022   Hepatitis A immune 11/26/2021   Past Surgical History:  Procedure Laterality Date   COLONOSCOPY     UPPER GASTROINTESTINAL ENDOSCOPY     Social History   Socioeconomic History   Marital status: Married    Spouse name: Not on file   Number of children: 3   Years of education: Not on file   Highest education level: Not on file  Occupational History   Occupation: self employed  Tobacco Use   Smoking status: Never   Smokeless tobacco: Never  Vaping Use   Vaping status: Never Used  Substance and Sexual Activity   Alcohol use: No   Drug use: No   Sexual activity: Not on file  Other Topics Concern   Not on file  Social History Narrative   Not on file   Social Drivers of Health   Financial Resource Strain: Not on file  Food Insecurity: No Food Insecurity  (12/26/2023)   Hunger Vital Sign    Worried About Running Out of Food in the Last Year: Never true    Ran Out of Food in the Last Year: Never true  Transportation Needs: No Transportation Needs (12/26/2023)   PRAPARE - Administrator, Civil Service (Medical): No    Lack of Transportation (Non-Medical): No  Physical Activity: Not on file  Stress: Not on file  Social Connections: Socially Integrated (12/26/2023)   Social Connection and Isolation Panel    Frequency of Communication with Friends and Family: More than three times a week    Frequency of Social Gatherings with Friends and Family: Three times a week    Attends Religious Services: More than 4 times per year    Active Member of Clubs or Organizations: Yes    Attends Banker Meetings: More than 4 times per year    Marital Status: Married  Catering manager Violence: Not At Risk (12/26/2023)   Humiliation, Afraid, Rape, and Kick questionnaire    Fear of Current or Ex-Partner: No    Emotionally Abused: No    Physically Abused: No    Sexually Abused: No   Family History  Problem Relation Age of Onset   Healthy Mother    Healthy Father    Colon cancer Neg Hx    Stomach cancer Neg Hx    Esophageal cancer Neg Hx    Rectal cancer Neg Hx    Vitals  BP 102/65   Pulse 63   Temp 98.8 F (37.1 C) (Oral)   Wt 123 lb 3.2 oz (55.9 kg)   SpO2 98%   BMI 15.82 kg/m    Gen: no acute distress, thin appearing HEENT: [place]/AT, no scleral icterus, no pale conjunctivae, hearing normal, oral mucosa moist Neck: Supple Cardio: Regular rate and rhythm Resp: CTAB GI: nondistended GU: Musc: Extremities: No pedal edema Skin: No rashes Neuro: Grossly non focal, awake, alert and oriented * 3 Psych: Calm, cooperative  Laboratory  Latest Ref Rng & Units 12/25/2023    9:20 PM 09/30/2023    3:07 PM 09/03/2023    9:05 AM  CBC  WBC 4.0 - 10.5 K/uL 5.3  4.1  4.3   Hemoglobin 13.0 - 17.0 g/dL 86.3  86.2  86.1    Hematocrit 39.0 - 52.0 % 38.0  40.6  40.3   Platelets 150 - 400 K/uL 281  261.0  246       Latest Ref Rng & Units 03/28/2024    1:52 PM 03/24/2024   10:12 AM 12/25/2023    9:20 PM  CMP  Glucose 70 - 99 mg/dL  75  95   BUN 6 - 24 mg/dL  10  11   Creatinine 9.23 - 1.27 mg/dL  8.96  9.19   Sodium 865 - 144 mmol/L  130  130   Potassium 3.5 - 5.2 mmol/L  4.2  3.8   Chloride 96 - 106 mmol/L  94  96   CO2 20 - 29 mmol/L  21  25   Calcium  8.7 - 10.2 mg/dL  8.8  8.6   Total Protein 6.1 - 8.1 g/dL 6.9   7.2   Total Bilirubin 0.2 - 1.2 mg/dL 0.7   0.6   Alkaline Phos 38 - 126 U/L   49   AST 10 - 35 U/L 22   25   ALT 9 - 46 U/L 9 - 46 U/L 15    17   20     Imaging  01/26/24 RUQ US  FINDINGS: Gallbladder:   No gallstones or wall thickening visualized. Trace sludge. No sonographic Murphy sign noted by sonographer.   Common bile duct:   Diameter: Visualized portion measures 2 mm, within normal limits. Majority isn't visualized.   Liver:   No focal lesion identified. Within normal limits in parenchymal echogenicity. Portal vein is patent on color Doppler imaging with normal direction of blood flow towards the liver.   Other: None.   IMPRESSION: No sonographic etiology for epigastric pain is identified.  Assessment/Plan: # Chronic Hepatitis B - US  RUQ in May 2025 with no focal lesion or cirrhosis - 7/15 labs reviewed and discussed with normal ALT and HBV DNA down to 66 from 153. No indication for anti viral tx - fu in 4 months after US   # HCC screening  - Risk factor - male age over 50 and african origin  - US  abdomen w elastography ordered for mid November 2025 and then every 6 months thereafter   # Preventive measures for HBV have been discussed   I spent 20 minutes involved in face-to-face and non-face-to-face activities for this patient on the day of the visit. Professional time spent includes the following activities: Preparing to see the patient (review of tests),  Performing a medically appropriate examination and evaluation , Ordering US , Documenting clinical information in the EMR, Independently interpreting results (not separately reported), Communicating results to the patient, Counseling and educating the patient.  Patients questions were addressed and answered.   Of note, portions of this note may have been created with voice recognition software. While this note has been edited for accuracy, occasional wrong-word or 'sound-a-like' substitutions may have occurred due to the inherent limitations of voice recognition software.   Electronically signed by:  Annalee Orem, MD Infectious Diseases  Office phone [phone] Fax no. [phone]

## 2024-04-18 ENCOUNTER — Ambulatory Visit (INDEPENDENT_AMBULATORY_CARE_PROVIDER_SITE_OTHER): Admitting: Family Medicine

## 2024-04-18 VITALS — BP 98/67 | HR 55 | Ht 74.0 in | Wt 123.4 lb

## 2024-04-18 DIAGNOSIS — H6992 Unspecified Eustachian tube disorder, left ear: Secondary | ICD-10-CM

## 2024-04-18 MED ORDER — FLUTICASONE PROPIONATE 50 MCG/ACT NA SUSP: 2.0000 | Freq: Every day | NASAL | 6 refills | Status: DC

## 2024-04-18 NOTE — Patient Instructions (Signed)
 It was wonderful to see you today.  Please bring ALL of your medications with you to every visit.   Today we talked about:  Ear pain - I believe you have something called eustachian tube dysfunction. This is when there is pressure behind the ear from fluid that does not drain very well. This can happen sometimes after getting a viral respiratory infection which you had recently. You can try using the nasal spray for a week or two and see if this helps. You can also follow up with the ENT doctor since they have seen you for this before. If the flonase  spray helps before you see them you could cancel your appointment with them; however, if it does not give relief they can talk to you about other options.   Please follow up with your primary care doctor for an annual physical within the next couple months.   Thank you for choosing [facility].   Please call [phone] with any questions about today's appointment.   Areta Saliva, MD  Family Medicine

## 2024-04-18 NOTE — Progress Notes (Signed)
    SUBJECTIVE:   CHIEF COMPLAINT / HPI:   Left ear pain  Patient says that he has been having left ear pain and fullness since he went on the [place] about a month ago.  When he came back he had a viral URI that had caused a lot of of rhinosinusitis.  Most of his symptoms improved however the ear fullness and pain did not.  Denies fevers.  Denies changes in hearing.  Denies difficulty swallowing.  Says he sometimes feels the discomfort when swallowing.  However he does not have dysphagia or difficulty swallowing or coughing.  He has had this issue before and has seen ENT multiple years in the past before at that time was thought that maybe patient had eustachian tube dysfunction.  He has also had impacted cerumen previously and most recently had his ears cleaned out in April.  Patient has been trying over-the-counter eardrops for the last few days but says that these have not helped.  Has not been trying anything else for this issue.  PERTINENT  PMH / PSH: Eustachian tube dysfunction, GERD  OBJECTIVE:   BP 98/67   Pulse (!) 55   Ht 6' 2 (1.88 m)   Wt 123 lb 6.4 oz (56 kg)   SpO2 98%   BMI 15.84 kg/m   General: Thin, in no acute distress HEENT: No cervical lymphadenopathy, no oropharyngeal abnormalities visible,  L ear : no tenderness in front of the tragus or behind the ear, no discomfort with manipulation of the pinna, very little cerumen, mild effusion behind the TM, no erythema or bulging of the TM  No tenderness to palpation of frontal or maxillary sinuses  ASSESSMENT/PLAN:   Assessment & Plan Eustachian tube dysfunction, left Most likely patient has recurrence of eustachian tube dysfunction given recent viral URI history and evidence of effusion. - Advised patient to stop eardrops and recommended starting Flonase  to trial for relief of eustachian tube dysfunction - Patient will call to follow-up with ENT as he has been seen for this previously there if pain symptoms do not  improve     Areta Saliva, MD [facility]

## 2024-04-26 ENCOUNTER — Other Ambulatory Visit: Payer: Self-pay | Admitting: Family Medicine

## 2024-04-26 DIAGNOSIS — E782 Mixed hyperlipidemia: Secondary | ICD-10-CM

## 2024-04-28 ENCOUNTER — Other Ambulatory Visit: Payer: Self-pay

## 2024-04-28 ENCOUNTER — Encounter (HOSPITAL_BASED_OUTPATIENT_CLINIC_OR_DEPARTMENT_OTHER): Payer: Self-pay

## 2024-04-28 ENCOUNTER — Emergency Department (HOSPITAL_BASED_OUTPATIENT_CLINIC_OR_DEPARTMENT_OTHER)
Admission: EM | Admit: 2024-04-28 | Discharge: 2024-04-28 | Disposition: A | Discharge status: Home / Self Care | Attending: Emergency Medicine | Admitting: Emergency Medicine

## 2024-04-28 DIAGNOSIS — Z79899 Other long term (current) drug therapy: Secondary | ICD-10-CM | POA: Diagnosis not present

## 2024-04-28 DIAGNOSIS — K610 Anal abscess: Secondary | ICD-10-CM | POA: Insufficient documentation

## 2024-04-28 DIAGNOSIS — Z8673 Personal history of transient ischemic attack (TIA), and cerebral infarction without residual deficits: Secondary | ICD-10-CM | POA: Diagnosis not present

## 2024-04-28 DIAGNOSIS — Z7982 Long term (current) use of aspirin: Secondary | ICD-10-CM | POA: Insufficient documentation

## 2024-04-28 MED ORDER — LIDOCAINE HCL (PF) 1 % IJ SOLN
10.0000 mL | Freq: Once | INTRAMUSCULAR | Status: DC
  Filled 2024-04-28: qty 10

## 2024-04-28 MED ORDER — DOXYCYCLINE HYCLATE 100 MG PO CAPS: 100.0000 mg | ORAL_CAPSULE | Freq: Two times a day (BID) | ORAL | 0 refills | Status: AC

## 2024-04-28 MED ORDER — DOXYCYCLINE HYCLATE 100 MG PO TABS
100.0000 mg | ORAL_TABLET | Freq: Once | ORAL | Status: AC
  Administered 2024-04-28: 100 mg via ORAL
  Filled 2024-04-28: qty 1

## 2024-04-28 MED ORDER — TETANUS-DIPHTH-ACELL PERTUSSIS 5-2.5-18.5 LF-MCG/0.5 IM SUSY
0.5000 mL | PREFILLED_SYRINGE | Freq: Once | INTRAMUSCULAR | Status: DC
  Filled 2024-04-28: qty 0.5

## 2024-04-28 MED ORDER — HYDROCODONE-ACETAMINOPHEN 5-325 MG PO TABS
1.0000 | ORAL_TABLET | Freq: Once | ORAL | Status: AC
  Administered 2024-04-28: 1 via ORAL
  Filled 2024-04-28: qty 1

## 2024-04-28 NOTE — ED Triage Notes (Signed)
 Pt reports a boil on buttocks X 3 days. Endorses pain to area. No drainage. Denies other symptoms

## 2024-04-28 NOTE — Discharge Instructions (Addendum)
 Patient revealed that you have a tiny perianal abscess.  For now we will treat with antibiotics.  If the abscess becomes larger, you develop abdominal pain, fever, cannot have a bowel movement or any other concerning symptom please return to the ED for further evaluation.  Otherwise recommend that you follow with your PCP.  Sent doxycycline  to your pharmacy.  You can take Tylenol  ibuprofen  for pain.

## 2024-04-28 NOTE — ED Notes (Signed)
 Pt alert and oriented X 4 at the time of discharge. RR even and unlabored. No acute distress noted. Pt verbalized understanding of discharge instructions as discussed. Pt ambulatory to lobby at time of discharge.

## 2024-04-28 NOTE — ED Provider Notes (Signed)
  EMERGENCY DEPARTMENT AT [facility] Provider Note   CSN: [id number] Arrival date & time: 04/28/24  9168     Patient presents with: Abscess  HPI Juan Palmer is a 53 y.o. male with chronic hepatitis B, HSV 1, TIA presenting for anal abscess.  He reports he noticed it about 3 days ago.  He states is located about the top of the anus.  Denies oozing or bleeding.  But states its tender to touch.  Does report he has had an abscess in that area before which is required draining.  Denies fever and chills.    Abscess      Prior to Admission medications   Medication Sig Start Date End Date Taking? Authorizing Provider  doxycycline  (VIBRAMYCIN ) 100 MG capsule Take 1 capsule (100 mg total) by mouth 2 (two) times daily. 04/28/24  Yes Lang Norleen POUR, PA-C  aspirin  EC 81 MG tablet Take 1 tablet (81 mg total) by mouth daily. Swallow whole. 12/28/23   Ghimire, Donalda HERO, MD  atorvastatin  (LIPITOR) 40 MG tablet TAKE 1 TABLET BY MOUTH EVERY DAY 04/27/24   Romelle Booty, MD  Blood Pressure KIT 1 application  by Does not apply route as needed. 06/02/22   Austin Ade, MD  clopidogrel  (PLAVIX ) 75 MG tablet Take 1 tablet (75 mg total) by mouth daily. 12/28/23   Ghimire, Donalda HERO, MD  diclofenac  Sodium (VOLTAREN ) 1 % GEL APPLY 2 GRAMS TO AFFECTED AREA 4 TIMES A DAY 02/11/24   Romelle Booty, MD  famotidine  (PEPCID ) 20 MG tablet Take 1 tablet (20 mg total) by mouth 2 (two) times daily. Patient not taking: Reported on 04/11/2024 03/16/24   Geiple, Joshua, PA-C  fluticasone  (FLONASE ) 50 MCG/ACT nasal spray Place 2 sprays into both nostrils daily. 04/18/24   Nicholas Bar, MD  nitroGLYCERIN  (NITROSTAT ) 0.3 MG SL tablet Place 1 tablet (0.3 mg total) under the tongue every 5 (five) minutes as needed for chest pain. 10/09/22   Austin Ade, MD  OPZELURA 1.5 % CREA Apply 1 Application topically 2 (two) times daily. 08/30/23   [provider]  pantoprazole  (PROTONIX ) 40 MG tablet TAKE 1  TABLET BY MOUTH EVERY DAY 03/03/24   Romelle Booty, MD  sucralfate  (CARAFATE ) 1 g tablet Take 1 tablet (1 g total) by mouth 4 (four) times daily -  with meals and at bedtime. 03/16/24   Geiple, Joshua, PA-C  Zoster Vaccine Adjuvanted (SHINGRIX ) injection Administer Shingrix  vaccination now and repeat in two months 03/24/24   Romelle Booty, MD    Allergies: Patient has no known allergies.    Review of Systems See HPI  Updated Vital Signs BP 102/78 (BP Location: Right Arm)   Pulse (!) 58   Temp 98.6 F (37 C)   Resp 16   Ht 6' 2 (1.88 m)   Wt 57.2 kg   SpO2 99%   BMI 16.18 kg/m   Physical Exam Exam conducted with a chaperone present.  Constitutional:      Appearance: Normal appearance.  HENT:     Head: Normocephalic.     Nose: Nose normal.  Eyes:     Conjunctiva/sclera: Conjunctivae normal.  Pulmonary:     Effort: Pulmonary effort is normal.  Genitourinary:  Neurological:     Mental Status: He is alert.  Psychiatric:        Mood and Affect: Mood normal.     (all labs ordered are listed, but only abnormal results are displayed) Labs Reviewed - No data to  display  EKG: None  Radiology: No results found.   Procedures   Medications Ordered in the ED  HYDROcodone -acetaminophen  (NORCO/VICODIN) 5-325 MG per tablet 1 tablet (1 tablet Oral Given 04/28/24 0919)  doxycycline  (VIBRA -TABS) tablet 100 mg (100 mg Oral Given 04/28/24 0926)                                    Medical Decision Making  53 year old well-appearing male presenting for abscess.  Exam notable for tiny perianal abscess.  Appears to be superficial, suspicion for associated deep space fluid collection is low.  Discussed drainage but patient refused.  Feel this is appropriate at this time.  Started him on doxycycline .  We did discussed strict return precautions.  Advised him to follow-up with his PCP.  Sent doxycycline  to his pharmacy.  Recommended Tylenol  ibuprofen  for home for pain.  Discharged in good  condition.     Final diagnoses:  Perianal abscess    ED Discharge Orders          Ordered    doxycycline  (VIBRAMYCIN ) 100 MG capsule  2 times daily        04/28/24 0927               Lang Norleen POUR, PA-C 04/28/24 9071    Lenor Hollering, MD 04/28/24 1223

## 2024-05-01 ENCOUNTER — Other Ambulatory Visit: Payer: Self-pay

## 2024-05-01 MED ORDER — TRIAMCINOLONE ACETONIDE 0.1 % EX CREA: 1.0000 | TOPICAL_CREAM | Freq: Two times a day (BID) | CUTANEOUS | 0 refills | Status: AC

## 2024-05-01 NOTE — Telephone Encounter (Signed)
 Request from [facility] on [address] For TRIAMCINOLONE  0.1% PASTE. I dont see this on current medication list. If you want patient to start this medication. Please send to Rx with instructions, dosage and duration.

## 2024-07-21 ENCOUNTER — Ambulatory Visit (HOSPITAL_COMMUNITY)
Admission: RE | Admit: 2024-07-21 | Discharge: 2024-07-21 | Disposition: A | Discharge status: Home / Self Care | Source: Ambulatory Visit | Attending: Infectious Diseases | Admitting: Infectious Diseases

## 2024-07-21 DIAGNOSIS — Z129 Encounter for screening for malignant neoplasm, site unspecified: Secondary | ICD-10-CM | POA: Diagnosis present

## 2024-07-21 DIAGNOSIS — B181 Chronic viral hepatitis B without delta-agent: Secondary | ICD-10-CM | POA: Diagnosis present

## 2024-07-31 ENCOUNTER — Ambulatory Visit: Payer: Self-pay | Admitting: Infectious Diseases

## 2024-08-04 ENCOUNTER — Ambulatory Visit: Admitting: Infectious Diseases

## 2024-08-16 ENCOUNTER — Other Ambulatory Visit: Payer: Self-pay

## 2024-08-16 ENCOUNTER — Emergency Department (HOSPITAL_COMMUNITY)
Admission: EM | Admit: 2024-08-16 | Discharge: 2024-08-17 | Disposition: A | Attending: Emergency Medicine | Admitting: Emergency Medicine

## 2024-08-16 ENCOUNTER — Encounter (HOSPITAL_COMMUNITY): Payer: Self-pay

## 2024-08-16 ENCOUNTER — Emergency Department (HOSPITAL_COMMUNITY)

## 2024-08-16 DIAGNOSIS — R0789 Other chest pain: Secondary | ICD-10-CM | POA: Insufficient documentation

## 2024-08-16 DIAGNOSIS — M25512 Pain in left shoulder: Secondary | ICD-10-CM | POA: Insufficient documentation

## 2024-08-16 DIAGNOSIS — Z7982 Long term (current) use of aspirin: Secondary | ICD-10-CM | POA: Diagnosis not present

## 2024-08-16 DIAGNOSIS — Z7901 Long term (current) use of anticoagulants: Secondary | ICD-10-CM | POA: Diagnosis not present

## 2024-08-16 LAB — CBC
HCT: 39 % (ref 39.0–52.0)
Hemoglobin: 13.3 g/dL (ref 13.0–17.0)
MCH: 30.2 pg (ref 26.0–34.0)
MCHC: 34.1 g/dL (ref 30.0–36.0)
MCV: 88.6 fL (ref 80.0–100.0)
Platelets: 260 K/uL (ref 150–400)
RBC: 4.4 MIL/uL (ref 4.22–5.81)
RDW: 13 % (ref 11.5–15.5)
WBC: 4.5 K/uL (ref 4.0–10.5)
nRBC: 0 % (ref 0.0–0.2)

## 2024-08-16 LAB — BASIC METABOLIC PANEL WITH GFR
Anion gap: 3 — ABNORMAL LOW (ref 5–15)
BUN: 11 mg/dL (ref 6–20)
CO2: 29 mmol/L (ref 22–32)
Calcium: 8.4 mg/dL — ABNORMAL LOW (ref 8.9–10.3)
Chloride: 100 mmol/L (ref 98–111)
Creatinine, Ser: 0.98 mg/dL (ref 0.61–1.24)
GFR, Estimated: >60 mL/min (ref >60)
Glucose, Bld: 88 mg/dL (ref 70–99)
Potassium: 3.9 mmol/L (ref 3.5–5.1)
Sodium: 132 mmol/L — ABNORMAL LOW (ref 135–145)

## 2024-08-16 LAB — TROPONIN I (HIGH SENSITIVITY)
Troponin I (High Sensitivity): 2 ng/L (ref <18)
Troponin I (High Sensitivity): 2 ng/L (ref <18)

## 2024-08-16 LAB — CBG MONITORING, ED: Glucose-Capillary: 88 mg/dL (ref 70–99)

## 2024-08-16 NOTE — ED Triage Notes (Signed)
 Quick triage note : pt c/o left shoulder pain x 2 days and intermittent CP x 2 days, progressively getting worse, Denies SHOB, Denies cardiac hx.

## 2024-08-16 NOTE — ED Triage Notes (Addendum)
 Pt arrives POV c/o chest pain for 2-3 days. Pain beginning to radiate down L shoulder and L leg. Denies cardiac hx, no SHOB, no n/v and no dizziness. Pt took 1 81mg  Aspirin  before arrival

## 2024-08-16 NOTE — ED Provider Notes (Signed)
 [place] EMERGENCY DEPARTMENT AT [facility] Provider Note   CSN: [id number] Arrival date & time: 08/16/24  1650     Patient presents with: Chest Pain and Shoulder Pain (left)   Juan Palmer is a 53 y.o. male with history of GERD presents with complaints of chest pain over the past few days in addition to left shoulder pain.  Denies any injury or trauma.  Pain in his shoulder is worse with movement.  No numbness or tingling.  Has noticed that pain in his chest after he eats and when he lies down at night.  Symptoms are not exertional.  Not associated with any shortness of breath, dizziness or nausea.  Denies any cardiac history.  No prior blood clots.  No recent surgeries, travel or hospitalizations.  Denies any cough or URI symptoms.  His chest pain does not radiate.  Is centrally located.  {Add pertinent medical, surgical, social history, OB history to YEP:67052} =   Chest Pain Shoulder Pain  Past Medical History:  Diagnosis Date  . Constipation 10/23/2022  . Hepatitis A immune 11/26/2021   Past Surgical History:  Procedure Laterality Date  . COLONOSCOPY    . UPPER GASTROINTESTINAL ENDOSCOPY        Past Medical History:  Diagnosis Date  . Constipation 10/23/2022  . Hepatitis A immune 11/26/2021   Past Surgical History:  Procedure Laterality Date  . COLONOSCOPY    . UPPER GASTROINTESTINAL ENDOSCOPY       Prior to Admission medications   Medication Sig Start Date End Date Taking? Authorizing Provider  aspirin  EC 81 MG tablet Take 1 tablet (81 mg total) by mouth daily. Swallow whole. 12/28/23   Ghimire, Donalda HERO, MD  atorvastatin  (LIPITOR) 40 MG tablet TAKE 1 TABLET BY MOUTH EVERY DAY 04/27/24   Romelle Booty, MD  Blood Pressure KIT 1 application  by Does not apply route as needed. 06/02/22   Austin Ade, MD  clopidogrel  (PLAVIX ) 75 MG tablet Take 1 tablet (75 mg total) by mouth daily. 12/28/23   Ghimire, Donalda HERO, MD  diclofenac  Sodium (VOLTAREN ) 1 % GEL  APPLY 2 GRAMS TO AFFECTED AREA 4 TIMES A DAY 02/11/24   Romelle Booty, MD  doxycycline  (VIBRAMYCIN ) 100 MG capsule Take 1 capsule (100 mg total) by mouth 2 (two) times daily. 04/28/24   Robinson, John K, PA-C  famotidine  (PEPCID ) 20 MG tablet Take 1 tablet (20 mg total) by mouth 2 (two) times daily. Patient not taking: Reported on 04/11/2024 03/16/24   Geiple, Joshua, PA-C  fluticasone  (FLONASE ) 50 MCG/ACT nasal spray Place 2 sprays into both nostrils daily. 04/18/24   Nicholas Bar, MD  nitroGLYCERIN  (NITROSTAT ) 0.3 MG SL tablet Place 1 tablet (0.3 mg total) under the tongue every 5 (five) minutes as needed for chest pain. 10/09/22   Austin Ade, MD  OPZELURA 1.5 % CREA Apply 1 Application topically 2 (two) times daily. 08/30/23   [provider]  pantoprazole  (PROTONIX ) 40 MG tablet TAKE 1 TABLET BY MOUTH EVERY DAY 03/03/24   Romelle Booty, MD  sucralfate  (CARAFATE ) 1 g tablet Take 1 tablet (1 g total) by mouth 4 (four) times daily -  with meals and at bedtime. 03/16/24   Geiple, Joshua, PA-C  triamcinolone  cream (KENALOG ) 0.1 % Apply 1 Application topically 2 (two) times daily. 05/01/24   Romelle Booty, MD  Zoster Vaccine Adjuvanted (SHINGRIX ) injection Administer Shingrix  vaccination now and repeat in two months 03/24/24   Romelle Booty, MD    Allergies:  Patient has no known allergies.    Review of Systems  Cardiovascular:  Positive for chest pain.    Updated Vital Signs BP 101/63 (BP Location: Left Arm)   Pulse (!) 55   Temp 98 F (36.7 C)   Resp 18   Ht 6' 2 (1.88 m)   Wt 57.6 kg   SpO2 100%   BMI 16.31 kg/m   Physical Exam  (all labs ordered are listed, but only abnormal results are displayed) Labs Reviewed  BASIC METABOLIC PANEL WITH GFR - Abnormal; Notable for the following components:      Result Value   Sodium 132 (*)    Calcium  8.4 (*)    Anion gap 3 (*)    All other components within normal limits  CBC  CBG MONITORING, ED  TROPONIN I (HIGH SENSITIVITY)   TROPONIN I (HIGH SENSITIVITY)    EKG: None  Radiology: DG Chest 2 View Result Date: 08/16/2024 CLINICAL DATA:  Left shoulder pain and intermittent chest pain x2 days. EXAM: CHEST - 2 VIEW COMPARISON:  October 13, 2023 FINDINGS: The heart size and mediastinal contours are within normal limits. There is mild, stable biapical scarring and/or atelectasis. No acute infiltrate, pleural effusion or pneumothorax is identified. The visualized skeletal structures are unremarkable. IMPRESSION: No active cardiopulmonary disease. Electronically Signed   By: Suzen Dials M.D.   On: 08/16/2024 18:27    {Document cardiac monitor, telemetry assessment procedure when appropriate:32947} Procedures   Medications Ordered in the ED - No data to display  Clinical Course as of 08/16/24 2355  Wed Aug 16, 2024  2224 CBC Unremarkable [JT]  2224 Troponin I (High Sensitivity) Delta troponin flat [JT]  2224 Basic metabolic panel(!) Without significant findings [JT]  2224 DG Chest 2 View No acute findings [JT]  2225 EKG 12-Lead Normal sinus rhythm, new T wave inversion in lead III [JT]  2352 Patient evaluated for nonexertional intermittent chest pain over the past few nights that is worse after eating and reclining at night in addition to atraumatic left shoulder pain that is worse with left upper extremity movement.  Upon arrival he is hemodynamically stable.  On exam he does have some lateral tenderness to the left shoulder, there is no erythema, warmth or swelling.  He does have discomfort with provocative maneuvers such as Neer's and Hawkins.  Suspect musculoskeletal etiology.  His workup tonight is overall reassuring.  Clinically I suspect acid reflux.  Patient does take pantoprazole  as needed.  Encouraged him to take this daily and follow-up with his PCP in 1 week.  Will provide Ortho follow-up for his shoulder.  Strict return precautions provided.  Patient is understanding and agreement with plan. [JT]     Clinical Course User Index [JT] Donnajean Lynwood DEL, PA-C   {Click here for ABCD2, HEART and other calculators REFRESH Note before signing:1}                              Medical Decision Making Amount and/or Complexity of Data Reviewed Labs: ordered. Decision-making details documented in ED Course. Radiology: ordered. Decision-making details documented in ED Course. ECG/medicine tests:  Decision-making details documented in ED Course.   This patient presents to the ED with chief complaint(s) of chest pain .  The complaint involves an extensive differential diagnosis and also carries with it a high risk of complications and morbidity.   Pertinent past medical history as listed in HPI  The differential  diagnosis includes  Considered ACS however patient is without troponin elevation.. Additional history obtained: Records reviewed Care Everywhere/External Records  Disposition:   Patient will be discharged home. The patient has been appropriately medically screened and/or stabilized in the ED. I have low suspicion for any other emergent medical condition which would require further screening, evaluation or treatment in the ED or require inpatient management. At time of discharge the patient is hemodynamically stable and in no acute distress. I have discussed work-up results and diagnosis with patient and answered all questions. Patient is agreeable with discharge plan. We discussed strict return precautions for returning to the emergency department and they verbalized understanding.     Social Determinants of Health:   none  This note was dictated with voice recognition software.  Despite best efforts at proofreading, errors may have occurred which can change the documentation meaning.    {Document critical care time when appropriate  Document review of labs and clinical decision tools ie [id number], etc  Document your independent review of radiology images and any outside records   Document your discussion with family members, caretakers and with consultants  Document social determinants of health affecting pt's care  Document your decision making why or why not admission, treatments were needed:32947:::1}   Final diagnoses:  None    ED Discharge Orders     None

## 2024-08-17 ENCOUNTER — Ambulatory Visit: Admitting: Family Medicine

## 2024-08-17 VITALS — BP 98/55 | HR 73 | Ht 74.0 in | Wt 127.0 lb

## 2024-08-17 DIAGNOSIS — M7542 Impingement syndrome of left shoulder: Secondary | ICD-10-CM

## 2024-08-17 DIAGNOSIS — R0789 Other chest pain: Secondary | ICD-10-CM

## 2024-08-17 MED ORDER — FAMOTIDINE 20 MG PO TABS: 20.0000 mg | ORAL_TABLET | Freq: Two times a day (BID) | ORAL | 0 refills | Status: AC

## 2024-08-17 NOTE — Patient Instructions (Addendum)
 It was great to see you! Thank you for allowing me to participate in your care!  Our plans for today:   VISIT SUMMARY: You visited us  today due to chest and shoulder pain. We discussed your symptoms and created a plan to address each issue.  YOUR PLAN: ATYPICAL CHEST PAIN: You have been experiencing intermittent chest pain, which may be related to GERD. -You are being referred to a cardiologist for further evaluation. They may consider a CT angiography or a stress test. -Consider adding Pepcid  to your current GERD treatment regimen.  SUBACROMIAL IMPINGEMENT SYNDROME OF LEFT SHOULDER: Your left shoulder pain is likely due to impingement, possibly related to your sleeping position and arthritis. -We recommend physical therapy to strengthen your shoulder. -You can take Tylenol  or ibuprofen  for pain relief. -Follow up with an orthopedic specialist for further evaluation.  GASTROESOPHAGEAL REFLUX DISEASE (GERD): You have GERD with symptoms occurring at night and after meals. -Continue taking sucralfate  before meals and Protonix  after meals. -Add Pepcid  before bedtime to help manage your symptoms.   Please arrive 15 minutes PRIOR to your next scheduled appointment time! If you do not, this affects OTHER patients' care.  Take care and seek immediate care sooner if you develop any concerns.   Ozell Provencal, MD, PGY-3 [facility] 2:57 PM 08/17/2024  [facility]

## 2024-08-17 NOTE — Progress Notes (Signed)
    SUBJECTIVE:   CHIEF COMPLAINT / HPI: ED f/u  Discussed the use of AI scribe software for clinical note transcription with the patient, who gave verbal consent to proceed.  History of Present Illness Juan Palmer is a 53 year old male who presents with chest and shoulder pain.  Chest pain - Pressure sensation in the mid-chest - Episodes occurred twice at night over the past two weeks and once during the day after lunch - Each episode lasts approximately 30 minutes and resolves spontaneously - Not associated with physical exertion - No palpitations, pulmonary symptoms, or peripheral edema - History of chronic hypotension  Shoulder pain - Pain localized inside the shoulder, occasionally radiating down the arm - Suspects sleeping position may contribute to discomfort - No prior shoulder x-ray - Does worsen with extreme of shoulder flexion  Gastroesophageal symptoms - Takes sucralfate  before meals and Protonix  after meals for heartburn  - Went to ED yesterday for atypical chest pain - Reassuring EKG, troponins - Thought to be MSK related  PERTINENT  PMH / PSH: Hx of TIA, Prediabetes, GERD, Chronic Hep B  OBJECTIVE:   BP (!) 98/55   Pulse 73   Ht 6' 2 (1.88 m)   Wt 127 lb (57.6 kg)   SpO2 99%   BMI 16.31 kg/m   Physical Exam General: NAD, well appearing Neuro: A&O Cardiovascular: RRR, no murmurs,  Respiratory: normal WOB on A, CTAB, no wheezes, ronchi or rales Extremities: Moving all 4 extremities equally, no peripheral edema MUSCULOSKELETAL: No obvious deformity, erythema, swelling, no obvious tenderness to palpation along the clavicle, AC joint, acromion, scapula, biceps tendon, full range of motion active shoulder flexion, adduction, abduction, internal/external rotation, negative painful arc, negative empty can, negative crossarm, positive Hawkins, positive Neer's   ASSESSMENT/PLAN:   Assessment & Plan Atypical chest pain ED workup negative for cardiac  etiology.  Patient has low risk for cardiac disease without family history of MI, does not smoke, and last lipid panel was reassuring.  Shared decision making regarding referral to cardiology.  Will add Pepcid  back to his GERD regimen to see if this improves his intermittent chest pressure.  If not we will plan for referral to cardiology for stress test versus cardiac CT.  Historically low blood pressure, asymptomatic, doubt contributing to this presentation at this time. - Continue sulcrafate, continue Protonix  - Restart Pepcid  20 mg daily Subacromial impingement of left shoulder Likely secondary to mild OA given age and corresponding subacromial impingement.  Discussed initiation of conservative management.  Patient has referral from ED for orthopedic provider which he plans to follow-up with as well. - Provided with at home exercise regimen - Tylenol  ibuprofen  as needed for pain   Return in about 2 months (around 10/18/2024).  Ozell Provencal, MD, PGY-3 [facility] 3:02 PM 08/17/2024  [facility]

## 2024-08-17 NOTE — Discharge Instructions (Addendum)
 You were evaluated emergency room for chest pain and shoulder pain.  Your lab work and imaging did not show any significant abnormality.  Please try taking your acid reflux medication daily until you follow-up with your primary care doctor.  You additionally provided a referral for your shoulder pain.  Please call the orthopedic doctor and make an appointment.  If you experience any new or worsening symptoms please return to the emergency room.

## 2024-08-17 NOTE — ED Notes (Signed)
 Patient is A&Ox4 upon discharge. Patient verbalized understanding of discharge instructions and follow-up care. Patient ambulatory from ED with steady gait. GCS 15. AO x4.

## 2024-08-18 ENCOUNTER — Ambulatory Visit: Payer: Self-pay | Admitting: Family Medicine

## 2024-08-18 DIAGNOSIS — E782 Mixed hyperlipidemia: Secondary | ICD-10-CM

## 2024-08-18 LAB — LIPID PANEL
Chol/HDL Ratio: 3.1 ratio (ref 0.0–5.0)
Cholesterol, Total: 184 mg/dL (ref 100–199)
HDL: 60 mg/dL (ref >39)
LDL Chol Calc (NIH): 112 mg/dL — ABNORMAL HIGH (ref 0–99)
Triglycerides: 63 mg/dL (ref 0–149)
VLDL Cholesterol Cal: 12 mg/dL (ref 5–40)

## 2024-08-18 MED ORDER — ATORVASTATIN CALCIUM 80 MG PO TABS: 80.0000 mg | ORAL_TABLET | Freq: Every day | ORAL | 1 refills | Status: AC

## 2024-09-05 ENCOUNTER — Ambulatory Visit: Admitting: Family Medicine

## 2024-09-05 VITALS — BP 106/58 | HR 92 | Ht 74.0 in | Wt 128.0 lb

## 2024-09-05 DIAGNOSIS — K120 Recurrent oral aphthae: Secondary | ICD-10-CM | POA: Diagnosis present

## 2024-09-05 MED ORDER — TRIAMCINOLONE ACETONIDE 0.1 % MT PSTE: 1.0000 | PASTE | Freq: Two times a day (BID) | OROMUCOSAL | 12 refills | Status: AC

## 2024-09-05 NOTE — Patient Instructions (Signed)
 Use triamcinolone  paste as prescribed

## 2024-09-05 NOTE — Progress Notes (Signed)
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, GERD, prediabetes, chronic hepatitis B follows w ID most recent ultrasound/elastography normal and most recent LFTs normal  OBJECTIVE:   BP (!) 106/58   Pulse 92   Ht 6' 2 (1.88 m)   Wt 128 lb (58.1 kg)   SpO2 96%   BMI 16.43 kg/m    General: NAD, pleasant, able to participate in exam HEENT: small slightly erythematous spot noted in oral mucosa under tongue Respiratory: No respiratory distress Skin: warm and dry, no rashes noted Psych: Normal affect and mood  ASSESSMENT/PLAN:    Assessment & Plan Canker sore Refilled triamcinolone  dental paste as this has been very effective for patient Discussed supportive care, hygiene, mouthwash, return precautions   Payton Coward, MD [facility] "

## 2024-10-04 ENCOUNTER — Ambulatory Visit (INDEPENDENT_AMBULATORY_CARE_PROVIDER_SITE_OTHER): Admitting: Infectious Diseases

## 2024-10-04 ENCOUNTER — Encounter: Payer: Self-pay | Admitting: Infectious Diseases

## 2024-10-04 ENCOUNTER — Other Ambulatory Visit: Payer: Self-pay

## 2024-10-04 VITALS — BP 113/76 | HR 67 | Temp 98.5°F | Ht 74.0 in | Wt 130.0 lb

## 2024-10-04 DIAGNOSIS — B181 Chronic viral hepatitis B without delta-agent: Secondary | ICD-10-CM

## 2024-10-04 DIAGNOSIS — E43 Unspecified severe protein-calorie malnutrition: Secondary | ICD-10-CM

## 2024-10-04 DIAGNOSIS — Z129 Encounter for screening for malignant neoplasm, site unspecified: Secondary | ICD-10-CM

## 2024-10-04 NOTE — Progress Notes (Unsigned)
 [facility]                                      [address], [place], [place], [address]                                               Phn. [phone]; Fax: [phone]                                                                     Date: 10/04/24  Reason for Visit: Hepatitis B fu   HPI: Juan Palmer is a 54 y.o.old male, Originally from [place] with h/o prediabetes, TIA, GERD, h/o H pylori s/p tx with recurrence in 12/2021 s/p successful tx per chart in  who is here for follow up for chronic hepatitis B after being lost to fu since last seen in March 2023. He was seen in the ED several times since last seen as well as recently admitted in April 2025 for TIA like symptoms.   Reports going to [place] during the 1st 2 weeks of June for religious purpose and lost 6 lbs weight. Seen by PCP pn 7/11. Otherwise, no new concerns. Discussed plan for labs today and fu to review labs.   10/04/24 Doing well since last visit. No new concerns/complaints.   ROS: all systems reviewed with pertinent positives and negatives as listed above  Medications Ordered Prior to Encounter[1]   No Known Allergies  Past Medical History:  Diagnosis Date   Constipation 10/23/2022   Hepatitis A immune 11/26/2021   Past Surgical History:  Procedure Laterality Date   COLONOSCOPY     UPPER GASTROINTESTINAL ENDOSCOPY     Social History   Socioeconomic History   Marital status: Married    Spouse name: Not on file   Number of children: 3   Years of education: Not on file   Highest education level: Not on file  Occupational History   Occupation: self employed  Tobacco Use   Smoking status: Never   Smokeless tobacco: Never  Vaping Use   Vaping status: Never Used  Substance and Sexual Activity   Alcohol use: No   Drug use: No   Sexual activity: Not on file  Other Topics Concern   Not on file  Social History  Narrative   Not on file   Social Drivers of Health   Tobacco Use: Low Risk (09/05/2024)   Patient History    Smoking Tobacco Use: Never    Smokeless Tobacco Use: Never    Passive Exposure: Not on file  Financial Resource Strain: Not on file  Food Insecurity: No Food Insecurity (12/26/2023)   Hunger Vital Sign    Worried About Running Out of Food in the  Last Year: Never true    Ran Out of Food in the Last Year: Never true  Transportation Needs: No Transportation Needs (12/26/2023)   PRAPARE - Administrator, Civil Service (Medical): No    Lack of Transportation (Non-Medical): No  Physical Activity: Not on file  Stress: Not on file  Social Connections: Socially Integrated (12/26/2023)   Social Connection and Isolation Panel    Frequency of Communication with Friends and Family: More than three times a week    Frequency of Social Gatherings with Friends and Family: Three times a week    Attends Religious Services: More than 4 times per year    Active Member of Clubs or Organizations: Yes    Attends Banker Meetings: More than 4 times per year    Marital Status: Married  Catering Manager Violence: Not At Risk (12/26/2023)   Humiliation, Afraid, Rape, and Kick questionnaire    Fear of Current or Ex-Partner: No    Emotionally Abused: No    Physically Abused: No    Sexually Abused: No  Depression (PHQ2-9): Low Risk (08/17/2024)   Depression (PHQ2-9)    PHQ-2 Score: 0  Alcohol Screen: Not on file  Housing: Low Risk (12/26/2023)   Housing Stability Vital Sign    Unable to Pay for Housing in the Last Year: No    Number of Times Moved in the Last Year: 0    Homeless in the Last Year: No  Utilities: Not At Risk (12/26/2023)   AHC Utilities    Threatened with loss of utilities: No  Health Literacy: Not on file   Family History  Problem Relation Age of Onset   Healthy Mother    Healthy Father    Colon cancer Neg Hx    Stomach cancer Neg Hx    Esophageal  cancer Neg Hx    Rectal cancer Neg Hx    Vitals  BP 113/76   Pulse 67   Temp 98.5 F (36.9 C) (Temporal)   Ht 6' 2 (1.88 m)   Wt 130 lb (59 kg)   SpO2 99%   BMI 16.69 kg/m    Gen: no acute distress, thin appearing HEENT: [place]/AT, no scleral icterus, no pale conjunctivae, hearing normal, oral mucosa moist Neck: Supple Cardio: Normal HR Resp: Normal resp effort GI: nondistended GU: Musc: Extremities: No pedal edema Skin: No rashes Neuro: Grossly non focal, awake, alert Psych: Calm, cooperative  Laboratory     Latest Ref Rng & Units 08/16/2024    5:06 PM 12/25/2023    9:20 PM 09/30/2023    3:07 PM  CBC  WBC 4.0 - 10.5 K/uL 4.5  5.3  4.1   Hemoglobin 13.0 - 17.0 g/dL 86.6  86.3  86.2   Hematocrit 39.0 - 52.0 % 39.0  38.0  40.6   Platelets 150 - 400 K/uL 260  281  261.0       Latest Ref Rng & Units 08/16/2024    5:06 PM 03/28/2024    1:52 PM 03/24/2024   10:12 AM  CMP  Glucose 70 - 99 mg/dL 88   75   BUN 6 - 20 mg/dL 11   10   Creatinine 9.38 - 1.24 mg/dL 9.01   8.96   Sodium 864 - 145 mmol/L 132   130   Potassium 3.5 - 5.1 mmol/L 3.9   4.2   Chloride 98 - 111 mmol/L 100   94   CO2 22 - 32 mmol/L 29  21   Calcium  8.9 - 10.3 mg/dL 8.4   8.8   Total Protein 6.1 - 8.1 g/dL  6.9    Total Bilirubin 0.2 - 1.2 mg/dL  0.7    AST 10 - 35 U/L  22    ALT 9 - 46 U/L 9 - 46 U/L  15    17     Imaging  07/21/24 IMPRESSION: ULTRASOUND ABDOMEN:   Normal abdominal ultrasound examination.   ULTRASOUND HEPATIC ELASTOGRAPHY:   Median kPa:  3.3   Diagnostic category:  < or = 5 kPa: high probability of being normal  Assessment/Plan: # Chronic Hepatitis B - being monitored off tx - 03/28/24 labs reviewed and discussed  - labs today  - fu in 6 months pending labs  # HCC screening  - Risk factor - male age over 13 and african origin  - US  abdomen w elastography May 2026  and then every 6 months thereafter   # Preventive measures for HBV have been discussed   I  personally spent a total of 30 minutes in the care of the patient today including preparing to see the patient, getting/reviewing separately obtained history, performing a medically appropriate exam/evaluation, counseling and educating, placing orders, documenting clinical information in the EHR, independently interpreting results, and communicating results.   Electronically signed by:  Annalee Orem, MD Infectious Diseases  Office phone [phone] Fax no. [phone]     [1]  Current Outpatient Medications on File Prior to Visit  Medication Sig Dispense Refill   aspirin  EC 81 MG tablet Take 1 tablet (81 mg total) by mouth daily. Swallow whole. 30 tablet 12   atorvastatin  (LIPITOR) 80 MG tablet Take 1 tablet (80 mg total) by mouth daily. 90 tablet 1   Blood Pressure KIT 1 application  by Does not apply route as needed. 1 kit 0   diclofenac  Sodium (VOLTAREN ) 1 % GEL APPLY 2 GRAMS TO AFFECTED AREA 4 TIMES A DAY 100 g 1   doxycycline  (VIBRAMYCIN ) 100 MG capsule Take 1 capsule (100 mg total) by mouth 2 (two) times daily. 20 capsule 0   famotidine  (PEPCID ) 20 MG tablet Take 1 tablet (20 mg total) by mouth 2 (two) times daily. 15 tablet 0   OPZELURA 1.5 % CREA Apply 1 Application topically 2 (two) times daily.     pantoprazole  (PROTONIX ) 40 MG tablet TAKE 1 TABLET BY MOUTH EVERY DAY 90 tablet 1   sucralfate  (CARAFATE ) 1 g tablet Take 1 tablet (1 g total) by mouth 4 (four) times daily -  with meals and at bedtime. 60 tablet 0   triamcinolone  (KENALOG ) 0.1 % paste Use as directed 1 Application in the mouth or throat 2 (two) times daily. 5 g 12   triamcinolone  cream (KENALOG ) 0.1 % Apply 1 Application topically 2 (two) times daily. 30 g 0   Zoster Vaccine Adjuvanted (SHINGRIX ) injection Administer Shingrix  vaccination now and repeat in two months 1 each 1   No current facility-administered medications on file prior to visit.   "

## 2024-10-07 LAB — COMPREHENSIVE METABOLIC PANEL WITH GFR
AG Ratio: 1.8 (calc) (ref 1.0–2.5)
ALT: 33 U/L (ref 9–46)
AST: 31 U/L (ref 10–35)
Albumin: 4.6 g/dL (ref 3.6–5.1)
Alkaline phosphatase (APISO): 61 U/L (ref 35–144)
BUN: 10 mg/dL (ref 7–25)
CO2: 30 mmol/L (ref 20–32)
Calcium: 9.3 mg/dL (ref 8.6–10.3)
Chloride: 101 mmol/L (ref 98–110)
Creat: 0.95 mg/dL (ref 0.70–1.30)
Globulin: 2.6 g/dL (ref 1.9–3.7)
Glucose, Bld: 110 mg/dL — ABNORMAL HIGH (ref 65–99)
Potassium: 3.9 mmol/L (ref 3.5–5.3)
Sodium: 136 mmol/L (ref 135–146)
Total Bilirubin: 0.8 mg/dL (ref 0.2–1.2)
Total Protein: 7.2 g/dL (ref 6.1–8.1)
eGFR: 96 mL/min/{1.73_m2}

## 2024-10-07 LAB — HEPATITIS B SURFACE ANTIGEN: Hepatitis B Surface Ag: REACTIVE — AB

## 2024-10-07 LAB — HEPATITIS B DNA, ULTRAQUANTITATIVE, PCR
Hepatitis B DNA: 153 [IU]/mL — ABNORMAL HIGH
Hepatitis B virus DNA: 2.18 {Log_IU}/mL — ABNORMAL HIGH

## 2024-10-08 ENCOUNTER — Ambulatory Visit: Payer: Self-pay | Admitting: Infectious Diseases

## 2025-01-18 ENCOUNTER — Ambulatory Visit (HOSPITAL_COMMUNITY)

## 2025-04-03 ENCOUNTER — Ambulatory Visit: Payer: Self-pay | Admitting: Infectious Diseases
